# Patient Record
Sex: Male | Born: 1965 | Race: Black or African American | Hispanic: No | Marital: Single | Smoking: Current every day smoker
Health system: Southern US, Community
[De-identification: ages and names within clinical notes are randomized; demographics above are authoritative.]

## PROBLEM LIST (undated history)

## (undated) HISTORY — PX: OTHER SURGICAL HISTORY: SHX169

---

## 1998-03-09 ENCOUNTER — Emergency Department (HOSPITAL_COMMUNITY): Admission: EM | Admit: 1998-03-09 | Discharge: 1998-03-09 | Payer: Self-pay | Admitting: Emergency Medicine

## 2006-12-05 ENCOUNTER — Emergency Department (HOSPITAL_COMMUNITY): Admission: EM | Admit: 2006-12-05 | Discharge: 2006-12-05 | Payer: Self-pay | Admitting: Emergency Medicine

## 2008-11-26 ENCOUNTER — Emergency Department (HOSPITAL_COMMUNITY): Admission: EM | Admit: 2008-11-26 | Discharge: 2008-11-27 | Payer: Self-pay | Admitting: Emergency Medicine

## 2008-12-06 ENCOUNTER — Emergency Department (HOSPITAL_COMMUNITY): Admission: EM | Admit: 2008-12-06 | Discharge: 2008-12-06 | Payer: Self-pay | Admitting: Emergency Medicine

## 2009-12-01 ENCOUNTER — Emergency Department (HOSPITAL_COMMUNITY): Admission: EM | Admit: 2009-12-01 | Discharge: 2009-12-01 | Payer: Self-pay | Admitting: Emergency Medicine

## 2009-12-20 ENCOUNTER — Ambulatory Visit (HOSPITAL_COMMUNITY): Admission: RE | Admit: 2009-12-20 | Discharge: 2009-12-20 | Payer: Self-pay | Admitting: Internal Medicine

## 2010-03-14 ENCOUNTER — Encounter (INDEPENDENT_AMBULATORY_CARE_PROVIDER_SITE_OTHER): Payer: Self-pay | Admitting: Physical Medicine and Rehabilitation

## 2010-03-14 ENCOUNTER — Ambulatory Visit (HOSPITAL_COMMUNITY)
Admission: RE | Admit: 2010-03-14 | Discharge: 2010-03-14 | Payer: Self-pay | Source: Home / Self Care | Attending: Physical Medicine and Rehabilitation | Admitting: Physical Medicine and Rehabilitation

## 2010-04-27 ENCOUNTER — Encounter
Admission: RE | Admit: 2010-04-27 | Discharge: 2010-04-27 | Payer: Self-pay | Source: Home / Self Care | Attending: Internal Medicine | Admitting: Internal Medicine

## 2010-06-05 ENCOUNTER — Ambulatory Visit: Payer: Medicaid Other | Admitting: Rehabilitative and Restorative Service Providers"

## 2010-11-08 ENCOUNTER — Ambulatory Visit: Payer: Medicaid Other | Attending: Physical Medicine and Rehabilitation | Admitting: Occupational Therapy

## 2010-11-08 DIAGNOSIS — M2550 Pain in unspecified joint: Secondary | ICD-10-CM | POA: Insufficient documentation

## 2010-11-08 DIAGNOSIS — M6281 Muscle weakness (generalized): Secondary | ICD-10-CM | POA: Insufficient documentation

## 2010-11-08 DIAGNOSIS — M2560 Stiffness of unspecified joint, not elsewhere classified: Secondary | ICD-10-CM | POA: Insufficient documentation

## 2010-11-08 DIAGNOSIS — IMO0001 Reserved for inherently not codable concepts without codable children: Secondary | ICD-10-CM | POA: Insufficient documentation

## 2010-11-15 ENCOUNTER — Encounter: Payer: Medicaid Other | Admitting: Occupational Therapy

## 2010-11-22 ENCOUNTER — Encounter: Payer: Medicaid Other | Admitting: Occupational Therapy

## 2010-11-29 ENCOUNTER — Ambulatory Visit: Payer: Medicaid Other | Admitting: Occupational Therapy

## 2010-11-29 ENCOUNTER — Emergency Department (HOSPITAL_COMMUNITY)
Admission: EM | Admit: 2010-11-29 | Discharge: 2010-11-29 | Disposition: A | Discharge status: Home / Self Care | Payer: Medicaid Other | Attending: Emergency Medicine | Admitting: Emergency Medicine

## 2011-11-18 ENCOUNTER — Emergency Department (HOSPITAL_COMMUNITY)
Admission: EM | Admit: 2011-11-18 | Discharge: 2011-11-19 | Disposition: A | Discharge status: Home / Self Care | Payer: Self-pay | Attending: Emergency Medicine | Admitting: Emergency Medicine

## 2011-11-18 ENCOUNTER — Encounter (HOSPITAL_COMMUNITY): Payer: Self-pay | Admitting: Emergency Medicine

## 2011-11-18 DIAGNOSIS — F17200 Nicotine dependence, unspecified, uncomplicated: Secondary | ICD-10-CM | POA: Insufficient documentation

## 2011-11-18 DIAGNOSIS — K029 Dental caries, unspecified: Secondary | ICD-10-CM | POA: Insufficient documentation

## 2011-11-18 DIAGNOSIS — K0889 Other specified disorders of teeth and supporting structures: Secondary | ICD-10-CM

## 2011-11-18 NOTE — ED Notes (Signed)
Pt states he had oral surgery about a month ago and they damaged some of his teeth  For the past couple days he has been having mouth pain

## 2011-11-19 MED ORDER — TRAMADOL HCL 50 MG PO TABS
50.0000 mg | ORAL_TABLET | Freq: Once | ORAL | Status: AC
  Administered 2011-11-19: 50 mg via ORAL
  Filled 2011-11-19: qty 1

## 2011-11-19 MED ORDER — TRAMADOL HCL 50 MG PO TABS: 50.0000 mg | ORAL_TABLET | Freq: Four times a day (QID) | ORAL | Status: AC | PRN

## 2011-11-19 MED ORDER — PENICILLIN V POTASSIUM 500 MG PO TABS: 500.0000 mg | ORAL_TABLET | Freq: Three times a day (TID) | ORAL | Status: AC

## 2011-11-19 NOTE — ED Provider Notes (Signed)
Medical screening examination/treatment/procedure(s) were performed by non-physician practitioner and as supervising physician I was immediately available for consultation/collaboration.  Joakim Huesman M Santa Abdelrahman, MD 11/19/11 0821 

## 2011-11-19 NOTE — ED Provider Notes (Signed)
History     CSN: [id number]  Arrival date & time 11/18/11  2336   First MD Initiated Contact with Patient 11/19/11 0145      Chief Complaint  Patient presents with  . Dental Pain   HPI  History provided by the patient. Patient is a 46 year old male with no significant past medical history who presents complaints of dental pains for the past few days. Patient states that he has had off-and-on pains in left upper lower teeth for the past several weeks to months. Pain has increased significantly over the last few days. Patient does report having oral surgery to have a tooth removed one month ago. He does state he had similar symptoms at that time but his dentist only removed one tooth. Pain is worse with any kind shooting. Patient has been using some ibuprofen without significant relief. He has not used any other medication or treatment. He denies any swelling of the face or gums. He denies any difficulty swallowing or breathing. Denies any fever, chills, sweats.    History reviewed. No pertinent past medical history.  Past Surgical History  Procedure Date  . Arm surgery     Family History  Problem Relation Age of Onset  . Hypertension Other   . Diabetes Other   . Cancer Other     History  Substance Use Topics  . Smoking status: Current Everyday Smoker    Types: Cigarettes  . Smokeless tobacco: Not on file  . Alcohol Use: Yes     occ      Review of Systems  Constitutional: Negative for fever and chills.  HENT: Positive for dental problem. Negative for sore throat and trouble swallowing.   Gastrointestinal: Negative for nausea and vomiting.    Allergies  Tylenol  Home Medications  No current outpatient prescriptions on file.  BP 117/71  Pulse 76  Temp 97.5 F (36.4 C) (Oral)  Resp 16  Ht 6\' 1"  (1.854 m)  Wt 225 lb (102.059 kg)  BMI 29.69 kg/m2  SpO2 98%  Physical Exam  Nursing note and vitals reviewed. Constitutional: He is oriented to person, place, and  time. He appears well-developed and well-nourished. No distress.  HENT:  Head: Normocephalic and atraumatic.  Mouth/Throat:         Decay of left upper first molar to the gumline. There is pain to percussion over the left upper second premolar. Several dental caries throughout mouth. There is also tenderness along the left lower first premolar and molar teeth. No swelling of the gums or under the tongue.  Cardiovascular: Normal rate and regular rhythm.   Pulmonary/Chest: Effort normal and breath sounds normal.  Lymphadenopathy:    He has no cervical adenopathy.  Neurological: He is alert and oriented to person, place, and time.  Skin: Skin is warm.    ED Course  Procedures     1. Pain, dental       MDM  1:50PM patient seen and evaluated. Patient offered dental block but prefers not to have any needles. Patient reports adverse tolerance Tylenol with GI upset. Will give dose of Ultram and provide prescription for the same. Patient given dental referral.        Angus Seller, PA 11/19/11 [phone]

## 2012-01-16 ENCOUNTER — Emergency Department (HOSPITAL_COMMUNITY)
Admission: EM | Admit: 2012-01-16 | Discharge: 2012-01-16 | Disposition: A | Discharge status: Home / Self Care | Payer: Self-pay | Attending: Emergency Medicine | Admitting: Emergency Medicine

## 2012-01-16 ENCOUNTER — Emergency Department (HOSPITAL_COMMUNITY): Payer: Self-pay

## 2012-01-16 ENCOUNTER — Encounter (HOSPITAL_COMMUNITY): Payer: Self-pay | Admitting: *Deleted

## 2012-01-16 DIAGNOSIS — S53106A Unspecified dislocation of unspecified ulnohumeral joint, initial encounter: Secondary | ICD-10-CM | POA: Insufficient documentation

## 2012-01-16 DIAGNOSIS — S53006A Unspecified dislocation of unspecified radial head, initial encounter: Secondary | ICD-10-CM

## 2012-01-16 DIAGNOSIS — W11XXXA Fall on and from ladder, initial encounter: Secondary | ICD-10-CM | POA: Insufficient documentation

## 2012-01-16 MED ORDER — HYDROCODONE-ACETAMINOPHEN 5-325 MG PO TABS
1.0000 | ORAL_TABLET | Freq: Once | ORAL | Status: AC
  Administered 2012-01-16: 1 via ORAL
  Filled 2012-01-16: qty 1

## 2012-01-16 MED ORDER — HYDROCODONE-ACETAMINOPHEN 5-325 MG PO TABS: 1.0000 | ORAL_TABLET | Freq: Four times a day (QID) | ORAL | Status: DC | PRN

## 2012-01-16 NOTE — ED Notes (Signed)
Pt reports fall yesterday from a 58ft ladder.  Pt reports he remembers falling and getting up.  Pt reports landing on his L side and face.  Pt reports L upper arm pain and R facial pain.  Pt guarding his L arm.

## 2012-01-16 NOTE — ED Provider Notes (Signed)
History     CSN: [id number]  Arrival date & time 01/16/12  0919   First MD Initiated Contact with Patient 01/16/12 1009      Chief Complaint  Patient presents with  . Fall    L upper arm pain    (Consider location/radiation/quality/duration/timing/severity/associated sxs/prior treatment) HPI The patient presents to the ER with L elbow and L shoulder pain following a fall. The patient states that he was working on a ladder when he missed a rung and fell off. The patient denies LOC, back pain, neck pain, chest pain, abdominal pain, or vomiting. The patient states that he did not take anything prior to arrival.  History reviewed. No pertinent past medical history.  Past Surgical History  Procedure Date  . Arm surgery     Family History  Problem Relation Age of Onset  . Hypertension Other   . Diabetes Other   . Cancer Other     History  Substance Use Topics  . Smoking status: Current Every Day Smoker -- 0.5 packs/day    Types: Cigarettes  . Smokeless tobacco: Not on file  . Alcohol Use: Yes     occ      Review of Systems All other systems negative except as documented in the HPI. All pertinent positives and negatives as reviewed in the HPI.  Allergies  Review of patient's allergies indicates no known allergies.  Home Medications   Current Outpatient Rx  Name Route Sig Dispense Refill  . IBUPROFEN 200 MG PO TABS Oral Take 400 mg by mouth every 6 (six) hours as needed. Pain      Temp 98.7 F (37.1 C) (Oral)  Physical Exam  Nursing note and vitals reviewed. Constitutional: He is oriented to person, place, and time. He appears well-developed and well-nourished.  HENT:  Head: Normocephalic and atraumatic.  Eyes: Pupils are equal, round, and reactive to light.  Cardiovascular: Normal rate, regular rhythm and normal heart sounds.   Pulmonary/Chest: Effort normal and breath sounds normal. No respiratory distress.  Musculoskeletal:       Left shoulder: He  exhibits tenderness. He exhibits normal range of motion and no bony tenderness.       Left elbow: He exhibits swelling. He exhibits normal range of motion, no effusion and no deformity. tenderness found. Radial head tenderness noted.  Neurological: He is alert and oriented to person, place, and time.    ED Course  Procedures (including critical care time)  Labs Reviewed - No data to display Dg Elbow Complete Left  01/16/2012  *RADIOLOGY REPORT*  Clinical Data: Larey Seat.  Elbow pain.  Chronic deformity.  LEFT ELBOW - COMPLETE 3+ VIEW  Comparison: 12/20/2009.  Findings: Exam is quite limited due to chronic deformity.  The lateral film demonstrates a probable slightly impacted radial head fracture. No obvious joint effusion.  IMPRESSION:  1.  Limited examination. 2.  Suspect a slightly impacted radial head fracture.   Original Report Authenticated By: P. Loralie Champagne, M.D.    Dg Shoulder Left  01/16/2012  *RADIOLOGY REPORT*  Clinical Data: Fall.  Left shoulder pain.  LEFT SHOULDER - 2+ VIEW  Comparison: 12/20/2009 radiographs.  Findings: Stable dysmorphic appearance of the humeral head and glenoid.  Glenoid is shallow.  The humeral head is posteriorly located on the scapular Y view, similar to prior exam and compatible with chronic posterior dislocation.  Lateral downsloping of the acromion with upward curvature of the acromion.  No fracture.  IMPRESSION: Chronic changes of the left shoulder  with dysmorphic glenoid and humeral head.  Chronic posterior shoulder dislocation.   Original Report Authenticated By: Andreas Newport, M.D.    The patient is splinted and referred to ortho for further care and management. The patient is advised to use ice and heat on his elbow. Told to return here for any worsening in his condition.    MDM         Carlyle Dolly, PA-C 01/20/12 [phone]

## 2012-01-20 NOTE — ED Provider Notes (Signed)
Medical screening examination/treatment/procedure(s) were performed by non-physician practitioner and as supervising physician I was immediately available for consultation/collaboration.  Juliet Rude. Rubin Payor, MD 01/20/12 1537

## 2012-03-14 ENCOUNTER — Emergency Department (HOSPITAL_COMMUNITY)
Admission: EM | Admit: 2012-03-14 | Discharge: 2012-03-14 | Disposition: A | Discharge status: Home / Self Care | Payer: Self-pay | Attending: Emergency Medicine | Admitting: Emergency Medicine

## 2012-03-14 ENCOUNTER — Encounter (HOSPITAL_COMMUNITY): Payer: Self-pay | Admitting: *Deleted

## 2012-03-14 DIAGNOSIS — B029 Zoster without complications: Secondary | ICD-10-CM | POA: Insufficient documentation

## 2012-03-14 DIAGNOSIS — F17200 Nicotine dependence, unspecified, uncomplicated: Secondary | ICD-10-CM | POA: Insufficient documentation

## 2012-03-14 DIAGNOSIS — Z8619 Personal history of other infectious and parasitic diseases: Secondary | ICD-10-CM | POA: Insufficient documentation

## 2012-03-14 DIAGNOSIS — M549 Dorsalgia, unspecified: Secondary | ICD-10-CM | POA: Insufficient documentation

## 2012-03-14 MED ORDER — ACYCLOVIR 400 MG PO TABS: 800.0000 mg | ORAL_TABLET | Freq: Every day | ORAL | Status: DC

## 2012-03-14 NOTE — ED Provider Notes (Signed)
History     CSN: [id number]  Arrival date & time 03/14/12  8657   First MD Initiated Contact with Patient 03/14/12 [phone]      Chief Complaint  Patient presents with  . Rash  . Back Pain    (Consider location/radiation/quality/duration/timing/severity/associated sxs/prior treatment) HPI Comments: This is a 46 year old male, who presents emergency department with chief complaint of rash on his back. Patient states the rash is been there for the past 2 days. It is associated with burning, tingling, and pain. Patient has past medical history remarkable for the chickenpox. Patient states he first noticed the rash after cutting trees. He has not tried anything to alleviate his symptoms. Patient is in moderate pain. Palpation makes the pain worse, nothing makes the pain better.  Patient is a 46 y.o. male presenting with back pain. The history is provided by the patient. No language interpreter was used.  Back Pain     History reviewed. No pertinent past medical history.  Past Surgical History  Procedure Date  . Arm surgery     Family History  Problem Relation Age of Onset  . Hypertension Other   . Diabetes Other   . Cancer Other     History  Substance Use Topics  . Smoking status: Current Every Day Smoker -- 0.5 packs/day    Types: Cigarettes  . Smokeless tobacco: Not on file  . Alcohol Use: Yes     Comment: occ      Review of Systems  Musculoskeletal: Positive for back pain.  All other systems reviewed and are negative.    Allergies  Review of patient's allergies indicates no known allergies.  Home Medications   Current Outpatient Rx  Name  Route  Sig  Dispense  Refill  . HYDROCODONE-ACETAMINOPHEN 5-325 MG PO TABS   Oral   Take 1 tablet by mouth every 6 (six) hours as needed for pain.   15 tablet   0   . IBUPROFEN 200 MG PO TABS   Oral   Take 400 mg by mouth every 6 (six) hours as needed. Pain           BP 128/59  Pulse 76  Temp 97.8 F (36.6 C)  (Oral)  Resp 16  Ht 6\' 1"  (1.854 m)  Wt 230 lb (104.327 kg)  BMI 30.34 kg/m2  SpO2 96%  Physical Exam  Nursing note and vitals reviewed. Constitutional: He is oriented to person, place, and time. He appears well-developed and well-nourished.  HENT:  Head: Normocephalic and atraumatic.  Eyes: Conjunctivae normal and EOM are normal.  Neck: Normal range of motion.  Cardiovascular: Normal rate.   Pulmonary/Chest: Effort normal.  Abdominal: He exhibits no distension.  Musculoskeletal: Normal range of motion.  Neurological: He is alert and oriented to person, place, and time.  Skin: Skin is warm and dry. Rash noted.       Scattered vesicles, classic zoster appearance in a single dermatome.  Psychiatric: He has a normal mood and affect. His behavior is normal. Judgment and thought content normal.    ED Course  Procedures (including critical care time)  Labs Reviewed - No data to display No results found.   1. Shingles       MDM  Were 16-year-old male with shingles. Will treat the patient with acyclovir and recommend capsaicin cream. I have seen this patient with Dr. Freida Busman. Patient is stable and ready for discharge. Patient understands and agrees with the plan.  Roxy Horseman, PA-C 03/14/12 1015

## 2012-03-14 NOTE — ED Notes (Signed)
Pt reports rash with pus draining on R axillary and R upper back x2 days.

## 2012-03-17 NOTE — ED Provider Notes (Signed)
Medical screening examination/treatment/procedure(s) were performed by non-physician practitioner and as supervising physician I was immediately available for consultation/collaboration.  Toy Baker, MD 03/17/12 [phone]

## 2012-08-19 ENCOUNTER — Emergency Department (HOSPITAL_COMMUNITY): Payer: Self-pay

## 2012-08-19 ENCOUNTER — Emergency Department (HOSPITAL_COMMUNITY)
Admission: EM | Admit: 2012-08-19 | Discharge: 2012-08-19 | Disposition: A | Discharge status: Home / Self Care | Payer: Self-pay | Attending: Emergency Medicine | Admitting: Emergency Medicine

## 2012-08-19 ENCOUNTER — Encounter (HOSPITAL_COMMUNITY): Payer: Self-pay | Admitting: *Deleted

## 2012-08-19 DIAGNOSIS — Z9889 Other specified postprocedural states: Secondary | ICD-10-CM | POA: Insufficient documentation

## 2012-08-19 DIAGNOSIS — M25511 Pain in right shoulder: Secondary | ICD-10-CM

## 2012-08-19 DIAGNOSIS — M25519 Pain in unspecified shoulder: Secondary | ICD-10-CM | POA: Insufficient documentation

## 2012-08-19 DIAGNOSIS — F17200 Nicotine dependence, unspecified, uncomplicated: Secondary | ICD-10-CM | POA: Insufficient documentation

## 2012-08-19 DIAGNOSIS — Z8776 Personal history of (corrected) congenital malformations of integument, limbs and musculoskeletal system: Secondary | ICD-10-CM | POA: Insufficient documentation

## 2012-08-19 DIAGNOSIS — Z87768 Personal history of other specified (corrected) congenital malformations of integument, limbs and musculoskeletal system: Secondary | ICD-10-CM | POA: Insufficient documentation

## 2012-08-19 MED ORDER — OXYCODONE-ACETAMINOPHEN 5-325 MG PO TABS
2.0000 | ORAL_TABLET | Freq: Once | ORAL | Status: AC
  Administered 2012-08-19: 2 via ORAL
  Filled 2012-08-19: qty 2

## 2012-08-19 MED ORDER — HYDROCODONE-ACETAMINOPHEN 5-325 MG PO TABS: 2.0000 | ORAL_TABLET | ORAL | Status: DC | PRN

## 2012-08-19 MED ORDER — NAPROXEN 500 MG PO TABS: 500.0000 mg | ORAL_TABLET | Freq: Two times a day (BID) | ORAL | Status: DC

## 2012-08-19 NOTE — ED Provider Notes (Signed)
History     CSN: [id number]  Arrival date & time 08/19/12  0414   First MD Initiated Contact with Patient 08/19/12 0431      Chief Complaint  Patient presents with  . Arm Injury    (Consider location/radiation/quality/duration/timing/severity/associated sxs/prior treatment) HPI Comments: 47 year old male who presents with a complaint of right shoulder pain. He states that he has had pain in his right shoulder for approximately 6 months. It was gradual in onset, persistent, gradually worsening and has been in tolerable over the last couple of months. He felt like he could not sleep well tonight because of the constant pain, thus he presents to the emergency department for evaluation. He denies numbness or weakness in the right upper extremity, pain is worse with range of motion of the right shoulder but he has no pain with elbow or wrist on that side. 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 He denies swelling of the shoulder, fever, vomiting or redness of the skin. He has been evaluated by [facility]oped[facility] in the past, this shoulder has been imaged approximately one year ago, he has never been told that he had arthritis or any other significant abnormalities according to his report.  Patient is a 47 y.o. male presenting with arm injury. The history is provided by the patient and medical records.  Arm Injury Associated symptoms: no fever     History reviewed. No pertinent past medical history.  Past Surgical History  Procedure Laterality Date  . Arm surgery      Family History  Problem Relation Age of Onset  . Hypertension Other   . Diabetes Other   . Cancer Other     History  Substance Use Topics  . Smoking status: Current Every Day Smoker -- 0.50 packs/day    Types: Cigarettes  . Smokeless tobacco: Not on file  . Alcohol Use: Yes     Comment: occ      Review of  Systems  Constitutional: Negative for fever.  Musculoskeletal: Negative for joint swelling.  Skin: Negative for rash.    Allergies  Review of patient's allergies indicates no known allergies.  Home Medications   Current Outpatient Rx  Name  Route  Sig  Dispense  Refill  . ibuprofen (ADVIL,MOTRIN) 200 MG tablet   Oral   Take 400 mg by mouth every 6 (six) hours as needed. Pain         . acyclovir (ZOVIRAX) 400 MG tablet   Oral   Take 2 tablets (800 mg total) by mouth 5 (five) times daily.   50 tablet   0   . HYDROcodone-acetaminophen (NORCO/VICODIN) 5-325 MG per tablet   Oral   Take 1 tablet by mouth every 6 (six) hours as needed for pain.   15 tablet   0   . HYDROcodone-acetaminophen (NORCO/VICODIN) 5-325 MG per tablet   Oral   Take 2 tablets by mouth every 4 (four) hours as needed for pain.   6 tablet   0   . naproxen (NAPROSYN) 500 MG tablet   Oral   Take 1 tablet (500 mg total) by mouth 2 (two) times daily with a meal.   30 tablet   0     BP 130/69  Pulse 65  Temp(Src) 97.9 F (36.6 C) (Oral)  Resp 16  Ht 6\' 1"  (1.854 m)  Wt 220 lb (99.791 kg)  BMI 29.03  kg/m2  SpO2 97%  Physical Exam  Nursing note and vitals reviewed. Constitutional: He appears well-developed and well-nourished. No distress.  HENT:  Head: Normocephalic and atraumatic.  Eyes: Conjunctivae are normal. No scleral icterus.  Pulmonary/Chest: Effort normal.  Musculoskeletal: He exhibits tenderness ( Tenderness with palpation over the right shoulder girdle, mild pain with internal and external rotation of the shoulder, moderate pain with abduction of the right shoulder.). He exhibits no edema.  Normal exam of the right elbow and wrist and hand, muscle atrophy of the left upper extremity with deformity. Bilateral lower extremities appear normal  Neurological:  Normal sensation and strength of the right upper extremity  Skin: Skin is warm and dry. No rash noted.    ED Course  Procedures  (including critical care time)  Labs Reviewed - No data to display No results found.   1. Shoulder pain, right       MDM  The patient has right upper extremity focal abnormality with pain in the right shoulder though this appears to be over the last 6 months. There is no signs of septic arthritis, no fever, no redness, no warmth. Given the length of symptoms I will obtain a x-ray to rule out significant arthritis or any mass lesion, bony lesion or other abnormalities. Pain medications ordered, patient has good orthopedic followup.   I have personally interpretted the xray of the R shoulder.  I see no signs of fracture or dislocation - I have informed the patient of the findings and have asked him to acquire his medical records and share them with his orthopedist this week.  Meds given in ED:  Medications  oxyCODONE-acetaminophen (PERCOCET/ROXICET) 5-325 MG per tablet 2 tablet (2 tablets Oral Given 08/19/12 0524)    New Prescriptions   HYDROCODONE-ACETAMINOPHEN (NORCO/VICODIN) 5-325 MG PER TABLET    Take 2 tablets by mouth every 4 (four) hours as needed for pain.   NAPROXEN (NAPROSYN) 500 MG TABLET    Take 1 tablet (500 mg total) by mouth 2 (two) times daily with a meal.         Vida Roller, MD 08/19/12 [phone]

## 2012-08-19 NOTE — ED Notes (Signed)
Pt reports right arm pain for the past 2 m onths. States he came to the ER tonight because he can not sleep.

## 2012-09-30 ENCOUNTER — Encounter (HOSPITAL_COMMUNITY): Payer: Self-pay | Admitting: *Deleted

## 2012-09-30 ENCOUNTER — Emergency Department (HOSPITAL_COMMUNITY)
Admission: EM | Admit: 2012-09-30 | Discharge: 2012-09-30 | Disposition: A | Discharge status: Home / Self Care | Payer: Self-pay | Attending: Emergency Medicine | Admitting: Emergency Medicine

## 2012-09-30 DIAGNOSIS — F17200 Nicotine dependence, unspecified, uncomplicated: Secondary | ICD-10-CM | POA: Insufficient documentation

## 2012-09-30 DIAGNOSIS — Z79899 Other long term (current) drug therapy: Secondary | ICD-10-CM | POA: Insufficient documentation

## 2012-09-30 DIAGNOSIS — R21 Rash and other nonspecific skin eruption: Secondary | ICD-10-CM | POA: Insufficient documentation

## 2012-09-30 MED ORDER — NYSTATIN 100000 UNIT/GM EX POWD: Freq: Four times a day (QID) | CUTANEOUS | Status: DC

## 2012-09-30 NOTE — ED Notes (Signed)
Pt reporting rash on groin area.  No relief from hydrocortisone cream.  Reports he's had rash for about 2 weeks.

## 2012-09-30 NOTE — ED Provider Notes (Signed)
History    CSN: [id number] Arrival date & time 09/30/12  0135  First MD Initiated Contact with Patient 09/30/12 0230     Chief Complaint  Patient presents with  . Rash   (Consider location/radiation/quality/duration/timing/severity/associated sxs/prior Treatment) HPI  HPI Comments: Jack Bennett is a 47 y.o. male who presents to the Emergency Department complaining of a rash to his groin area that he has had for 2 weeks. He has been using hydrocortisone cream which seems to have made it worse. Denies fever, chills, nausea, vomiting. History reviewed. No pertinent past medical history. Past Surgical History  Procedure Laterality Date  . Arm surgery     Family History  Problem Relation Age of Onset  . Hypertension Other   . Diabetes Other   . Cancer Other    History  Substance Use Topics  . Smoking status: Current Every Day Smoker -- 0.50 packs/day    Types: Cigarettes  . Smokeless tobacco: Not on file  . Alcohol Use: Yes     Comment: occ    Review of Systems  Constitutional: Negative for fever.       10 Systems reviewed and are negative for acute change except as noted in the HPI.  HENT: Negative for congestion.   Eyes: Negative for discharge and redness.  Respiratory: Negative for cough and shortness of breath.   Cardiovascular: Negative for chest pain.  Gastrointestinal: Negative for vomiting and abdominal pain.  Musculoskeletal: Negative for back pain.  Skin: Positive for rash.  Neurological: Negative for syncope, numbness and headaches.  Psychiatric/Behavioral:       No behavior change.    Allergies  Review of patient's allergies indicates no known allergies.  Home Medications   Current Outpatient Rx  Name  Route  Sig  Dispense  Refill  . acyclovir (ZOVIRAX) 400 MG tablet   Oral   Take 2 tablets (800 mg total) by mouth 5 (five) times daily.   50 tablet   0   . HYDROcodone-acetaminophen (NORCO/VICODIN) 5-325 MG per tablet   Oral   Take 1 tablet  by mouth every 6 (six) hours as needed for pain.   15 tablet   0   . HYDROcodone-acetaminophen (NORCO/VICODIN) 5-325 MG per tablet   Oral   Take 2 tablets by mouth every 4 (four) hours as needed for pain.   6 tablet   0   . ibuprofen (ADVIL,MOTRIN) 200 MG tablet   Oral   Take 400 mg by mouth every 6 (six) hours as needed. Pain         . naproxen (NAPROSYN) 500 MG tablet   Oral   Take 1 tablet (500 mg total) by mouth 2 (two) times daily with a meal.   30 tablet   0    BP 132/78  Pulse 73  Temp(Src) 97.5 F (36.4 C) (Oral)  Resp 20  Ht 6\' 1"  (1.854 m)  Wt 230 lb (104.327 kg)  BMI 30.35 kg/m2  SpO2 98% Physical Exam  Nursing note and vitals reviewed. Constitutional: He appears well-developed and well-nourished.  Awake, alert, nontoxic appearance.  HENT:  Head: Normocephalic and atraumatic.  Eyes: EOM are normal. Pupils are equal, round, and reactive to light.  Neck: Neck supple.  Cardiovascular: Normal rate and intact distal pulses.   Pulmonary/Chest: Effort normal. He exhibits no tenderness.  Abdominal: Soft. There is no tenderness. There is no rebound.  Genitourinary:  confluent rash to groin  Musculoskeletal: He exhibits no tenderness.  Baseline ROM, no  obvious new focal weakness.  Neurological:  Mental status and motor strength appears baseline for patient and situation.  Skin: No rash noted.  Psychiatric: He has a normal mood and affect.    ED Course  Procedures (including critical care time)  MDM  Patient with a c/o rash to the groin. Will Rx microgard powder. Pt stable in ED with no significant deterioration in condition.The patient appears reasonably screened and/or stabilized for discharge and I doubt any other medical condition or other [facility] requiring further screening, evaluation, or treatment in the ED at this time prior to discharge.  MDM Reviewed: nursing note and vitals     Nicoletta Dress. Colon Branch, MD 09/30/12 [phone]

## 2013-06-25 ENCOUNTER — Emergency Department (HOSPITAL_COMMUNITY)
Admission: EM | Admit: 2013-06-25 | Discharge: 2013-06-25 | Disposition: A | Discharge status: Home / Self Care | Payer: Self-pay

## 2013-06-25 NOTE — ED Notes (Signed)
Pt checked in and then walked out front door and hasn't returned

## 2013-06-27 ENCOUNTER — Encounter (HOSPITAL_COMMUNITY): Payer: Self-pay | Admitting: Emergency Medicine

## 2013-06-27 ENCOUNTER — Emergency Department (HOSPITAL_COMMUNITY)
Admission: EM | Admit: 2013-06-27 | Discharge: 2013-06-28 | Disposition: A | Discharge status: Home / Self Care | Payer: No Typology Code available for payment source | Attending: Emergency Medicine | Admitting: Emergency Medicine

## 2013-06-27 DIAGNOSIS — M545 Low back pain, unspecified: Secondary | ICD-10-CM

## 2013-06-27 DIAGNOSIS — IMO0002 Reserved for concepts with insufficient information to code with codable children: Secondary | ICD-10-CM | POA: Insufficient documentation

## 2013-06-27 DIAGNOSIS — Y92410 Unspecified street and highway as the place of occurrence of the external cause: Secondary | ICD-10-CM | POA: Insufficient documentation

## 2013-06-27 DIAGNOSIS — Y9389 Activity, other specified: Secondary | ICD-10-CM | POA: Insufficient documentation

## 2013-06-27 DIAGNOSIS — F17200 Nicotine dependence, unspecified, uncomplicated: Secondary | ICD-10-CM | POA: Insufficient documentation
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
## 2013-06-28 ENCOUNTER — Emergency Department (HOSPITAL_COMMUNITY): Payer: No Typology Code available for payment source

## 2013-06-28 MED ORDER — CYCLOBENZAPRINE HCL 5 MG PO TABS: 5.0000 mg | ORAL_TABLET | Freq: Three times a day (TID) | ORAL | Status: DC | PRN

## 2013-06-28 MED ORDER — DEXAMETHASONE SODIUM PHOSPHATE 10 MG/ML IJ SOLN
10.0000 mg | Freq: Once | INTRAMUSCULAR | Status: AC
  Administered 2013-06-28: 10 mg via INTRAMUSCULAR
  Filled 2013-06-28: qty 1

## 2013-06-28 MED ORDER — KETOROLAC TROMETHAMINE 30 MG/ML IJ SOLN
30.0000 mg | Freq: Once | INTRAMUSCULAR | Status: AC
  Administered 2013-06-28: 30 mg via INTRAMUSCULAR
  Filled 2013-06-28: qty 1

## 2013-06-28 MED ORDER — NAPROXEN 500 MG PO TABS: 500.0000 mg | ORAL_TABLET | Freq: Two times a day (BID) | ORAL | Status: DC
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
## 2013-06-28 NOTE — ED Notes (Signed)
Patient transported to X-ray via wheelchair 

## 2013-06-29 NOTE — ED Provider Notes (Signed)
Medical screening examination/treatment/procedure(s) were performed by non-physician practitioner and as supervising physician I was immediately available for consultation/collaboration.   EKG Interpretation None       Jack NielsenBrian Sanye Ledesma, MD 06/29/13 [phone]

## 2013-07-21 ENCOUNTER — Other Ambulatory Visit (HOSPITAL_COMMUNITY): Payer: Self-pay | Admitting: Chiropractic Medicine

## 2013-07-21 DIAGNOSIS — M545 Low back pain, unspecified: Secondary | ICD-10-CM

## 2013-07-27 ENCOUNTER — Ambulatory Visit (HOSPITAL_COMMUNITY)
Admission: RE | Admit: 2013-07-27 | Discharge: 2013-07-27 | Disposition: A | Discharge status: Home / Self Care | Payer: No Typology Code available for payment source | Source: Ambulatory Visit | Attending: Chiropractic Medicine | Admitting: Chiropractic Medicine

## 2013-07-27 DIAGNOSIS — M545 Low back pain, unspecified: Secondary | ICD-10-CM | POA: Insufficient documentation

## 2013-07-27 DIAGNOSIS — M5126 Other intervertebral disc displacement, lumbar region: Secondary | ICD-10-CM | POA: Insufficient documentation

## 2013-07-27 DIAGNOSIS — M47817 Spondylosis without myelopathy or radiculopathy, lumbosacral region: Secondary | ICD-10-CM | POA: Insufficient documentation

## 2019-02-24 ENCOUNTER — Other Ambulatory Visit: Payer: Self-pay

## 2019-02-24 ENCOUNTER — Emergency Department (HOSPITAL_COMMUNITY): Payer: Medicare Other

## 2019-02-24 ENCOUNTER — Emergency Department (HOSPITAL_COMMUNITY)
Admission: EM | Admit: 2019-02-24 | Discharge: 2019-02-24 | Disposition: A | Discharge status: Home / Self Care | Payer: Medicare Other | Attending: Emergency Medicine | Admitting: Emergency Medicine

## 2019-02-24 ENCOUNTER — Encounter (HOSPITAL_COMMUNITY): Payer: Self-pay

## 2019-02-24 DIAGNOSIS — F1210 Cannabis abuse, uncomplicated: Secondary | ICD-10-CM | POA: Insufficient documentation

## 2019-02-24 DIAGNOSIS — W1789XA Other fall from one level to another, initial encounter: Secondary | ICD-10-CM | POA: Diagnosis not present

## 2019-02-24 DIAGNOSIS — F17210 Nicotine dependence, cigarettes, uncomplicated: Secondary | ICD-10-CM | POA: Diagnosis not present

## 2019-02-24 DIAGNOSIS — Y999 Unspecified external cause status: Secondary | ICD-10-CM | POA: Insufficient documentation

## 2019-02-24 DIAGNOSIS — W19XXXA Unspecified fall, initial encounter: Secondary | ICD-10-CM

## 2019-02-24 DIAGNOSIS — Z79899 Other long term (current) drug therapy: Secondary | ICD-10-CM | POA: Insufficient documentation

## 2019-02-24 DIAGNOSIS — S0990XA Unspecified injury of head, initial encounter: Secondary | ICD-10-CM | POA: Insufficient documentation

## 2019-02-24 DIAGNOSIS — Y92008 Other place in unspecified non-institutional (private) residence as the place of occurrence of the external cause: Secondary | ICD-10-CM | POA: Diagnosis not present

## 2019-02-24 DIAGNOSIS — Y939 Activity, unspecified: Secondary | ICD-10-CM | POA: Insufficient documentation

## 2019-02-24 DIAGNOSIS — R109 Unspecified abdominal pain: Secondary | ICD-10-CM | POA: Insufficient documentation

## 2019-02-24 LAB — COMPREHENSIVE METABOLIC PANEL
ALT: 23 U/L (ref 0–44)
AST: 21 U/L (ref 15–41)
Albumin: 4.5 g/dL (ref 3.5–5.0)
Alkaline Phosphatase: 67 U/L (ref 38–126)
Anion gap: 8 (ref 5–15)
BUN: 18 mg/dL (ref 6–20)
CO2: 23 mmol/L (ref 22–32)
Calcium: 9.1 mg/dL (ref 8.9–10.3)
Chloride: 106 mmol/L (ref 98–111)
Creatinine, Ser: 0.99 mg/dL (ref 0.61–1.24)
GFR calc Af Amer: >60 mL/min (ref >60)
GFR calc non Af Amer: >60 mL/min (ref >60)
Glucose, Bld: 123 mg/dL — ABNORMAL HIGH (ref 70–99)
Potassium: 3.8 mmol/L (ref 3.5–5.1)
Sodium: 137 mmol/L (ref 135–145)
Total Bilirubin: 1 mg/dL (ref 0.3–1.2)
Total Protein: 7.5 g/dL (ref 6.5–8.1)

## 2019-02-24 LAB — CBC WITH DIFFERENTIAL/PLATELET
Abs Immature Granulocytes: 0.04 10*3/uL (ref 0.00–0.07)
Basophils Absolute: 0.1 10*3/uL (ref 0.0–0.1)
Basophils Relative: 1 %
Eosinophils Absolute: 0.3 10*3/uL (ref 0.0–0.5)
Eosinophils Relative: 3 %
HCT: 46.7 % (ref 39.0–52.0)
Hemoglobin: 15.5 g/dL (ref 13.0–17.0)
Immature Granulocytes: 0 %
Lymphocytes Relative: 14 %
Lymphs Abs: 1.3 10*3/uL (ref 0.7–4.0)
MCH: 29.6 pg (ref 26.0–34.0)
MCHC: 33.2 g/dL (ref 30.0–36.0)
MCV: 89.3 fL (ref 80.0–100.0)
Monocytes Absolute: 0.7 10*3/uL (ref 0.1–1.0)
Monocytes Relative: 8 %
Neutro Abs: 6.7 10*3/uL (ref 1.7–7.7)
Neutrophils Relative %: 74 %
Platelets: 320 10*3/uL (ref 150–400)
RBC: 5.23 MIL/uL (ref 4.22–5.81)
RDW: 13.4 % (ref 11.5–15.5)
WBC: 9 10*3/uL (ref 4.0–10.5)
nRBC: 0 % (ref 0.0–0.2)

## 2019-02-24 MED ORDER — MELOXICAM 15 MG PO TABS: 15.0000 mg | ORAL_TABLET | Freq: Every day | ORAL | 0 refills | Status: DC

## 2019-02-24 MED ORDER — METHOCARBAMOL 500 MG PO TABS: 500.0000 mg | ORAL_TABLET | Freq: Two times a day (BID) | ORAL | 0 refills | Status: DC

## 2019-02-24 MED ORDER — OXYCODONE-ACETAMINOPHEN 5-325 MG PO TABS: 1.0000 | ORAL_TABLET | Freq: Four times a day (QID) | ORAL | 0 refills | Status: AC | PRN

## 2019-02-24 MED ORDER — OXYCODONE-ACETAMINOPHEN 5-325 MG PO TABS
1.0000 | ORAL_TABLET | Freq: Once | ORAL | Status: AC
  Administered 2019-02-24: 1 via ORAL
  Filled 2019-02-24: qty 1

## 2019-02-24 MED ORDER — SODIUM CHLORIDE (PF) 0.9 % IJ SOLN
INTRAMUSCULAR | Status: AC
  Filled 2019-02-24: qty 50

## 2019-02-24 MED ORDER — IOHEXOL 300 MG/ML  SOLN
100.0000 mL | Freq: Once | INTRAMUSCULAR | Status: AC | PRN
  Administered 2019-02-24: 16:00:00 100 mL via INTRAVENOUS

## 2019-02-24 MED ORDER — HYDROCODONE-ACETAMINOPHEN 5-325 MG PO TABS: 1.0000 | ORAL_TABLET | Freq: Four times a day (QID) | ORAL | 0 refills | Status: DC | PRN

## 2019-02-24 MED ORDER — MELOXICAM 15 MG PO TABS: 15.0000 mg | ORAL_TABLET | Freq: Every day | ORAL | 0 refills | Status: AC

## 2019-02-24 MED ORDER — HYDROMORPHONE HCL 1 MG/ML IJ SOLN
1.0000 mg | Freq: Once | INTRAMUSCULAR | Status: AC
  Administered 2019-02-24: 18:00:00 1 mg via INTRAVENOUS
  Filled 2019-02-24: qty 1

## 2019-02-24 MED ORDER — HYDROMORPHONE HCL 1 MG/ML IJ SOLN: 1.0000 mg | Freq: Once | INTRAMUSCULAR | Status: DC

## 2019-02-24 NOTE — ED Provider Notes (Signed)
  Face-to-face evaluation   History: Here for evaluation of fall which occurred last night, he was leaning forward, slipped and lost balance, and was unable to catch himself so he fell forward onto his face.  Presents complaining of pain in the right flank and neck.  He also feels like he hit his face and head.  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  Physical exam: Alert, calm and cooperative.  Head with an abrasion and contusion left forehead.  Mild left cheek tenderness and swelling.  No trismus.  Somewhat diminished neck flexion secondary posterior cervical pain and tenderness.  Moderate lumbar tenderness to palpation.  Patient walked with a slow gait.  Medical screening examination/treatment/procedure(s) were conducted as a shared visit with non-physician practitioner(s) and myself.  I personally evaluated the patient during the encounter    Daleen Bo, MD 02/26/19 1313

## 2019-02-24 NOTE — ED Triage Notes (Addendum)
Patient reports falling from 61ft balcony onto his right side.   C/o right flank pain and right neck pain.    A/Ox4 Ambulatory in triage.   Denies blood thinners.

## 2019-02-24 NOTE — ED Provider Notes (Addendum)
[facility]-EMERGENCY DEPT Provider Note   CSN: [id number] Arrival date & time: 02/24/19  1321     History   Chief Complaint Chief Complaint  Patient presents with   Fall    HPI Jack Bennett is a 53 y.o. male with no significant past medical history who presents to the ED after a mechanical fall off a balcony last night around 9-10pm. Patient notes he was trying to close a door and lost balance which caused him to fall directly on his face onto rocks. 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 Patient admits to 10/10 neck pain, right flank pain, and face pain. Patient has not tried anything for pain. Pain is worse with movement. He is not on any blood thinners. Patient denies changes to vision. Patient denies chest pain, shortness of breath, nausea, vomiting, and diarrhea. No bowel/bladder incontinence or saddle paraesthesia.   History reviewed. No pertinent past medical history.  There are no active problems to display for this patient.   Past Surgical History:  Procedure Laterality Date   arm surgery          Home Medications    Prior to Admission medications   Medication Sig Start Date End Date Taking? Authorizing Provider  cyclobenzaprine (FLEXERIL) 5 MG tablet Take 1 tablet (5 mg total) by mouth 3 (three) times daily as needed for muscle spasms. 06/28/13   Rubye Oaks, Janene Harvey, PA-C  ibuprofen (ADVIL,MOTRIN) 200 MG tablet Take 400 mg by mouth every 6 (six) hours as needed for moderate pain. Pain    [provider]  meloxicam (MOBIC) 15 MG tablet Take 1 tablet (15 mg total) by mouth daily. 02/24/19   Cheek, Vesta Mixer, PA-C  methocarbamol (ROBAXIN) 500 MG tablet Take 1 tablet (500 mg total) by mouth 2 (two) times daily. 02/24/19   Cheek, Vesta Mixer, PA-C  naproxen (NAPROSYN) 500 MG tablet Take 1 tablet (500 mg total) by mouth 2 (two) times daily with a meal. 06/28/13   Palmer, Janene Harvey, PA-C    oxyCODONE-acetaminophen (PERCOCET/ROXICET) 5-325 MG tablet Take 1 tablet by mouth every 6 (six) hours as needed for severe pain. 02/24/19   Renee Harder, PA-C    Family History Family History  Problem Relation Age of Onset   Hypertension Other    Diabetes Other    Cancer Other     Social History Social History   Tobacco Use   Smoking status: Current Every Day Smoker    Packs/day: 0.50    Types: Cigarettes  Substance Use Topics   Alcohol use: Yes    Comment: occ   Drug use: Yes    Types: Marijuana     Allergies   Fish-derived products   Review of Systems Review of Systems  Constitutional: Negative for chills and fever.  Eyes: Negative for visual disturbance.  Respiratory: Negative for shortness of breath.   Cardiovascular: Negative for chest pain and leg swelling.  Genitourinary: Positive for flank pain (right flank pain). Negative for difficulty urinating and dysuria.  Musculoskeletal: Positive for back pain, gait problem, myalgias, neck pain and neck stiffness.  Neurological: Positive for headaches. Negative for numbness.     Physical Exam Updated Vital Signs BP (!) 142/84 (BP Location: Right Arm)    Pulse 89    Temp 98.2 F (36.8 C) (Oral)    Resp 15    Ht  (1.854 m)    Wt 99.8 kg  SpO2 97%    BMI 29.03 kg/m   Physical Exam Vitals signs and nursing note reviewed.  Constitutional:      General: He is not in acute distress.    Appearance: He is not toxic-appearing.     Comments: Appears uncomfortable in chair  HENT:     Head: Normocephalic.     Right Ear: Tympanic membrane normal.     Left Ear: Tympanic membrane normal.     Ears:     Comments: No hemotympanum    Nose: Nose normal.     Comments: No septal hematoma Eyes:     Extraocular Movements: Extraocular movements intact.     Pupils: Pupils are equal, round, and reactive to light.     Comments: Small abrasion over left eye. Tenderness to palpation over left orbit. No battle sign.  No raccoon eyes.   Neck:     Musculoskeletal: Neck supple.     Comments: Cervical midline tenderness. Limited ROM due to pain. No deformity. No crepitus. No stepoff. Cardiovascular:     Rate and Rhythm: Normal rate and regular rhythm.     Pulses: Normal pulses.     Heart sounds: Normal heart sounds. No murmur. No friction rub. No gallop.   Pulmonary:     Effort: Pulmonary effort is normal.     Breath sounds: Normal breath sounds.     Comments: Clear to auscultation bilaterally Abdominal:     General: Abdomen is flat. There is no distension.     Palpations: Abdomen is soft.     Tenderness: There is abdominal tenderness. There is no guarding or rebound.     Comments: Tenderness to palpation in right flank area.  Musculoskeletal:     Comments: No T-spine midlines tenderness. Lumbar midline tenderness. no stepoff or deformity, reproducible paraspinal tenderness in lumbar region. No leg edema bilaterally DP/PT pulses 2+ and equal bilaterally Sensation grossly intact bilaterally Able to ambulate in ED   Skin:    General: Skin is warm and dry.  Neurological:     General: No focal deficit present.     Comments: Speech is clear, able to follow commands CN III-XII intact Left arm with birth deformity, weak, and spastic Able to move all other extremities without ataxia       ED Treatments / Results  Labs (all labs ordered are listed, but only abnormal results are displayed) Labs Reviewed  COMPREHENSIVE METABOLIC PANEL - Abnormal; Notable for the following components:      Result Value   Glucose, Bld 123 (*)    All other components within normal limits  CBC WITH DIFFERENTIAL/PLATELET    EKG None  Radiology Ct Head Wo Contrast  Result Date: 02/24/2019 CLINICAL DATA:  Larey Seat from a 4 ft balcony to right side EXAM: CT HEAD WITHOUT CONTRAST CT MAXILLOFACIAL WITHOUT CONTRAST CT CERVICAL SPINE WITHOUT CONTRAST TECHNIQUE: Multidetector CT imaging of the head, cervical spine, and  maxillofacial structures were performed using the standard protocol without intravenous contrast. Multiplanar CT image reconstructions of the cervical spine and maxillofacial structures were also generated. COMPARISON:  None. FINDINGS: CT HEAD FINDINGS Brain: No evidence of acute infarction, hemorrhage, hydrocephalus, extra-axial collection or mass lesion/mass effect. Vascular: Atherosclerotic calcification of the carotid siphons. No hyperdense vessel. Skull: No calvarial fracture or suspicious osseous lesion. No scalp swelling or hematoma. Other: None CT MAXILLOFACIAL FINDINGS Osseous: No fracture of the bony orbits. Mildly comminuted bilateral nasal bone fractures. Nasal spines are intact. No mid face fractures are seen. The pterygoid  plates are intact. The mandible is intact. Temporomandibular joints are normally aligned. No temporal bone fractures are identified. No fractured or avulsed teeth. Several absent teeth as well as multiple carious lesions and periapical lucencies of the maxillary and mandibular dentition. Orbits: The globes appear normal and symmetric. Symmetric appearance of the extraocular musculature and optic nerve sheath complexes. Normal caliber of the superior ophthalmic veins. Sinuses: Mild mural thickening in the maxillary sinuses and ethmoids. Mastoid air cells and middle ear cavities are predominantly clear. Ossicular chains are normally configured. Soft tissues: Minimal asymmetric left periorbital and malar soft tissue swelling. Mild thickening across the nasal bridge. No soft tissue gas or foreign body. CT CERVICAL SPINE FINDINGS Alignment: No stabilization collar in place at the time of imaging. There is straightening of the normal cervical lordosis which is likely positional prop in of the patient's head. Craniocervical and atlantoaxial articulations are maintained. No abnormal facet widening is seen. No jumped or perched facets. Skull base and vertebrae: No acute fracture. No primary  bone lesion or focal pathologic process. Degenerative ossification is noted at the tip of the dens. Likely degenerative vertebral body height loss at C4 and C5. No suspicious osseous lesions. Soft tissues and spinal canal: No pre or paravertebral fluid or swelling. No visible canal hematoma. Disc levels: Mild multilevel intervertebral disc height loss with mild cervical spondylitic changes maximal C3-C6 without significant canal stenosis or foraminal narrowing the cervical spine. Upper chest: No acute abnormality in the upper chest or imaged lung apices. Other: None IMPRESSION: 1. No acute intracranial abnormality. No calvarial fracture or scalp hematoma. 2. Mildly comminuted bilateral nasal bone fractures. Mild soft tissue swelling. 3. Minimal left periorbital and malar soft tissue swelling. No other facial bone fracture. 4. Multiple carious lesions and periapical lucencies. Correlate with dental exam. 5. No acute cervical spine fracture or traumatic listhesis. Minimal cervical spondylitic changes. Electronically Signed   By: Kreg Shropshire M.D.   On: 02/24/2019 17:59   Ct Chest W Contrast  Result Date: 02/24/2019 CLINICAL DATA:  Blunt trauma. Fall from balcony onto right side. Right flank pain. Right neck pain. EXAM: CT CHEST, ABDOMEN, AND PELVIS WITH CONTRAST TECHNIQUE: Multidetector CT imaging of the chest, abdomen and pelvis was performed following the standard protocol during bolus administration of intravenous contrast. CONTRAST:  OMNIPAQUE IOHEXOL 300 MG/ML  SOLN COMPARISON:  None. FINDINGS: CT CHEST FINDINGS Cardiovascular: Normal heart size. No significant pericardial fluid/thickening. Great vessels are normal in course and caliber. No evidence of acute thoracic aortic injury. No central pulmonary emboli. Mediastinum/Nodes: No pneumomediastinum. No mediastinal hematoma. No discrete thyroid nodules. Unremarkable esophagus. No axillary, mediastinal or hilar lymphadenopathy. Lungs/Pleura: No  pneumothorax. No pleural effusion. No acute consolidative airspace disease, lung masses or significant pulmonary nodules. No pneumatoceles. Musculoskeletal: No aggressive appearing focal osseous lesions. No fracture detected in the chest. Minimal thoracic spondylosis. CT ABDOMEN PELVIS FINDINGS Hepatobiliary: Normal liver with no liver laceration or mass. Normal gallbladder with no radiopaque cholelithiasis. No biliary ductal dilatation. Pancreas: Normal, with no laceration, mass or duct dilation. Spleen: Normal size. No laceration or mass. Adrenals/Urinary Tract: Normal adrenals. No hydronephrosis. No renal laceration. No renal mass. Normal bladder. Stomach/Bowel: Grossly normal stomach. Normal caliber small bowel with no small bowel wall thickening. Normal appendix. Mild sigmoid diverticulosis, with no large bowel wall thickening or significant pericolonic fat stranding. Vascular/Lymphatic: Atherosclerotic nonaneurysmal abdominal aorta. No evidence of acute abdominal aortic injury. No pathologically enlarged lymph nodes in the abdomen or pelvis. Reproductive: Top-normal size prostate. Other:  No pneumoperitoneum, ascites or focal fluid collection. Musculoskeletal: No aggressive appearing focal osseous lesions. No fracture in the abdomen or pelvis. IMPRESSION: No acute traumatic injury in the chest, abdomen or pelvis. Mild sigmoid diverticulosis. Aortic Atherosclerosis (ICD10-I70.0). Electronically Signed   By: Delbert Phenix M.D.   On: 02/24/2019 18:00   Ct Cervical Spine Wo Contrast  Result Date: 02/24/2019 CLINICAL DATA:  Larey Seat from a 4 ft balcony to right side EXAM: CT HEAD WITHOUT CONTRAST CT MAXILLOFACIAL WITHOUT CONTRAST CT CERVICAL SPINE WITHOUT CONTRAST TECHNIQUE: Multidetector CT imaging of the head, cervical spine, and maxillofacial structures were performed using the standard protocol without intravenous contrast. Multiplanar CT image reconstructions of the cervical spine and maxillofacial structures  were also generated. COMPARISON:  None. FINDINGS: CT HEAD FINDINGS Brain: No evidence of acute infarction, hemorrhage, hydrocephalus, extra-axial collection or mass lesion/mass effect. Vascular: Atherosclerotic calcification of the carotid siphons. No hyperdense vessel. Skull: No calvarial fracture or suspicious osseous lesion. No scalp swelling or hematoma. Other: None CT MAXILLOFACIAL FINDINGS Osseous: No fracture of the bony orbits. Mildly comminuted bilateral nasal bone fractures. Nasal spines are intact. No mid face fractures are seen. The pterygoid plates are intact. The mandible is intact. Temporomandibular joints are normally aligned. No temporal bone fractures are identified. No fractured or avulsed teeth. Several absent teeth as well as multiple carious lesions and periapical lucencies of the maxillary and mandibular dentition. Orbits: The globes appear normal and symmetric. Symmetric appearance of the extraocular musculature and optic nerve sheath complexes. Normal caliber of the superior ophthalmic veins. Sinuses: Mild mural thickening in the maxillary sinuses and ethmoids. Mastoid air cells and middle ear cavities are predominantly clear. Ossicular chains are normally configured. Soft tissues: Minimal asymmetric left periorbital and malar soft tissue swelling. Mild thickening across the nasal bridge. No soft tissue gas or foreign body. CT CERVICAL SPINE FINDINGS Alignment: No stabilization collar in place at the time of imaging. There is straightening of the normal cervical lordosis which is likely positional prop in of the patient's head. Craniocervical and atlantoaxial articulations are maintained. No abnormal facet widening is seen. No jumped or perched facets. Skull base and vertebrae: No acute fracture. No primary bone lesion or focal pathologic process. Degenerative ossification is noted at the tip of the dens. Likely degenerative vertebral body height loss at C4 and C5. No suspicious osseous  lesions. Soft tissues and spinal canal: No pre or paravertebral fluid or swelling. No visible canal hematoma. Disc levels: Mild multilevel intervertebral disc height loss with mild cervical spondylitic changes maximal C3-C6 without significant canal stenosis or foraminal narrowing the cervical spine. Upper chest: No acute abnormality in the upper chest or imaged lung apices. Other: None IMPRESSION: 1. No acute intracranial abnormality. No calvarial fracture or scalp hematoma. 2. Mildly comminuted bilateral nasal bone fractures. Mild soft tissue swelling. 3. Minimal left periorbital and malar soft tissue swelling. No other facial bone fracture. 4. Multiple carious lesions and periapical lucencies. Correlate with dental exam. 5. No acute cervical spine fracture or traumatic listhesis. Minimal cervical spondylitic changes. Electronically Signed   By: Kreg Shropshire M.D.   On: 02/24/2019 17:59   Ct Thoracic Spine Wo Contrast  Result Date: 02/24/2019 CLINICAL DATA:  Fall from a forefoot bowel continue to right side, right flank and neck pain EXAM: CT THORACIC AND LUMBAR SPINE WITHOUT CONTRAST TECHNIQUE: Multiplanar thin slice reconstructions of the thoracic and lumbar spine were generated from the contemporary CT of the chest, abdomen and pelvis. COMPARISON:  Contemporary CT of the  chest, abdomen and pelvis from which is study is generated FINDINGS: CT THORACIC SPINE FINDINGS Alignment: 12 rib-bearing thoracic type vertebral bodies. Preservation of the normal thoracic kyphosis. Mild dextrocurvature of the thoracic spine may be positional. No traumatic listhesis. Vertebrae: No vertebral body fracture or height loss. No suspicious osseous lesions. Mild enthesopathic changes noted at the supraspinous insertion upon the spinous processes. Paraspinal and other soft tissues: No paraspinal fluid or swelling. No visible canal hematoma. No vertebral body paraspinal musculature is normal quality in bulk. Findings in the chest,  please see dedicated CT of the chest from which is studies generated. Disc levels: Mild multilevel intervertebral disc height loss with early discogenic changes of the thoracic spine maximal T3-T9. No significant canal stenosis or neural foraminal narrowing is evident. CT LUMBAR SPINE FINDINGS Segmentation: 5 lumbar type vertebral bodies. Alignment: Preservation of the normal lumbar lordosis. Mild retrolisthesis of L5 on S1 by approximately 3 mm with some associated facet degenerative changes. No spondylolysis or traumatic listhesis. Posterior elements remain normally aligned. Vertebrae: Vertebral bodies are normally formed. No acute fracture or vertebral body height loss. No suspicious osseous lesions. Mild degenerative changes are noted in the SI joints with arthrosis at the superior right SI joint. Included portions of the sacrum are intact. Paraspinal and other soft tissues: No paraspinal fluid or soft tissue thickening. The paraspinal musculature is normal quality in bulk. No visible canal hematoma. Disc levels: Mild disc bulging at L4-5 and L5-S1 with retrolisthesis of L5 on S1 as detailed above. No significant spinal canal stenosis. Mild facet degenerative changes are present L4-5 and L5-S1 as well with at most mild bilateral foraminal narrowing at these levels. No other significant foraminal stenosis. IMPRESSION: CT THORACIC SPINE IMPRESSION 1. No acute fracture or traumatic listhesis. 2. Minimal discogenic changes in the thoracic spine, as detailed above. 3. Mild dextrocurvature of the thoracic spine, possibly positional. CT LUMBAR SPINE IMPRESSION 1. No acute fracture or traumatic listhesis. 2. 3 mm of retrolisthesis L5 on S1 likely on a degenerative basis. 3. Mild disc bulges at L4-5 and L5-S1 without significant canal stenosis. 4. Facet degenerative changes L4-5 and L5-S1 with mild bilateral neural foraminal narrowing. No other significant foraminal narrowing. For full findings in the chest, abdomen and  pelvis, please see dedicated report of the study from which this imaging is generated Electronically Signed   By: Lovena Le M.D.   On: 02/24/2019 18:11   Ct Abdomen Pelvis W Contrast  Result Date: 02/24/2019 CLINICAL DATA:  Blunt trauma. Fall from balcony onto right side. Right flank pain. Right neck pain. EXAM: CT CHEST, ABDOMEN, AND PELVIS WITH CONTRAST TECHNIQUE: Multidetector CT imaging of the chest, abdomen and pelvis was performed following the standard protocol during bolus administration of intravenous contrast. CONTRAST:  122mL OMNIPAQUE IOHEXOL 300 MG/ML  SOLN COMPARISON:  None. FINDINGS: CT CHEST FINDINGS Cardiovascular: Normal heart size. No significant pericardial fluid/thickening. Great vessels are normal in course and caliber. No evidence of acute thoracic aortic injury. No central pulmonary emboli. Mediastinum/Nodes: No pneumomediastinum. No mediastinal hematoma. No discrete thyroid nodules. Unremarkable esophagus. No axillary, mediastinal or hilar lymphadenopathy. Lungs/Pleura: No pneumothorax. No pleural effusion. No acute consolidative airspace disease, lung masses or significant pulmonary nodules. No pneumatoceles. Musculoskeletal: No aggressive appearing focal osseous lesions. No fracture detected in the chest. Minimal thoracic spondylosis. CT ABDOMEN PELVIS FINDINGS Hepatobiliary: Normal liver with no liver laceration or mass. Normal gallbladder with no radiopaque cholelithiasis. No biliary ductal dilatation. Pancreas: Normal, with no laceration, mass or duct dilation.  Spleen: Normal size. No laceration or mass. Adrenals/Urinary Tract: Normal adrenals. No hydronephrosis. No renal laceration. No renal mass. Normal bladder. Stomach/Bowel: Grossly normal stomach. Normal caliber small bowel with no small bowel wall thickening. Normal appendix. Mild sigmoid diverticulosis, with no large bowel wall thickening or significant pericolonic fat stranding. Vascular/Lymphatic: Atherosclerotic  nonaneurysmal abdominal aorta. No evidence of acute abdominal aortic injury. No pathologically enlarged lymph nodes in the abdomen or pelvis. Reproductive: Top-normal size prostate. Other: No pneumoperitoneum, ascites or focal fluid collection. Musculoskeletal: No aggressive appearing focal osseous lesions. No fracture in the abdomen or pelvis. IMPRESSION: No acute traumatic injury in the chest, abdomen or pelvis. Mild sigmoid diverticulosis. Aortic Atherosclerosis (ICD10-I70.0). Electronically Signed   By: Delbert Phenix M.D.   On: 02/24/2019 18:00   Ct L-spine No Charge  Result Date: 02/24/2019 CLINICAL DATA:  Fall from a forefoot bowel continue to right side, right flank and neck pain EXAM: CT THORACIC AND LUMBAR SPINE WITHOUT CONTRAST TECHNIQUE: Multiplanar thin slice reconstructions of the thoracic and lumbar spine were generated from the contemporary CT of the chest, abdomen and pelvis. COMPARISON:  Contemporary CT of the chest, abdomen and pelvis from which is study is generated FINDINGS: CT THORACIC SPINE FINDINGS Alignment: 12 rib-bearing thoracic type vertebral bodies. Preservation of the normal thoracic kyphosis. Mild dextrocurvature of the thoracic spine may be positional. No traumatic listhesis. Vertebrae: No vertebral body fracture or height loss. No suspicious osseous lesions. Mild enthesopathic changes noted at the supraspinous insertion upon the spinous processes. Paraspinal and other soft tissues: No paraspinal fluid or swelling. No visible canal hematoma. No vertebral body paraspinal musculature is normal quality in bulk. Findings in the chest, please see dedicated CT of the chest from which is studies generated. Disc levels: Mild multilevel intervertebral disc height loss with early discogenic changes of the thoracic spine maximal T3-T9. No significant canal stenosis or neural foraminal narrowing is evident. CT LUMBAR SPINE FINDINGS Segmentation: 5 lumbar type vertebral bodies. Alignment:  Preservation of the normal lumbar lordosis. Mild retrolisthesis of L5 on S1 by approximately 3 mm with some associated facet degenerative changes. No spondylolysis or traumatic listhesis. Posterior elements remain normally aligned. Vertebrae: Vertebral bodies are normally formed. No acute fracture or vertebral body height loss. No suspicious osseous lesions. Mild degenerative changes are noted in the SI joints with arthrosis at the superior right SI joint. Included portions of the sacrum are intact. Paraspinal and other soft tissues: No paraspinal fluid or soft tissue thickening. The paraspinal musculature is normal quality in bulk. No visible canal hematoma. Disc levels: Mild disc bulging at L4-5 and L5-S1 with retrolisthesis of L5 on S1 as detailed above. No significant spinal canal stenosis. Mild facet degenerative changes are present L4-5 and L5-S1 as well with at most mild bilateral foraminal narrowing at these levels. No other significant foraminal stenosis. IMPRESSION: CT THORACIC SPINE IMPRESSION 1. No acute fracture or traumatic listhesis. 2. Minimal discogenic changes in the thoracic spine, as detailed above. 3. Mild dextrocurvature of the thoracic spine, possibly positional. CT LUMBAR SPINE IMPRESSION 1. No acute fracture or traumatic listhesis. 2. 3 mm of retrolisthesis L5 on S1 likely on a degenerative basis. 3. Mild disc bulges at L4-5 and L5-S1 without significant canal stenosis. 4. Facet degenerative changes L4-5 and L5-S1 with mild bilateral neural foraminal narrowing. No other significant foraminal narrowing. For full findings in the chest, abdomen and pelvis, please see dedicated report of the study from which this imaging is generated Electronically Signed   By: Samuella Cota  [facility] M.D.   On: 02/24/2019 18:11   Ct Maxillofacial Wo Contrast  Result Date: 02/24/2019 CLINICAL DATA:  Larey Seat from a 4 ft balcony to right side EXAM: CT HEAD WITHOUT CONTRAST CT MAXILLOFACIAL WITHOUT CONTRAST CT CERVICAL  SPINE WITHOUT CONTRAST TECHNIQUE: Multidetector CT imaging of the head, cervical spine, and maxillofacial structures were performed using the standard protocol without intravenous contrast. Multiplanar CT image reconstructions of the cervical spine and maxillofacial structures were also generated. COMPARISON:  None. FINDINGS: CT HEAD FINDINGS Brain: No evidence of acute infarction, hemorrhage, hydrocephalus, extra-axial collection or mass lesion/mass effect. Vascular: Atherosclerotic calcification of the carotid siphons. No hyperdense vessel. Skull: No calvarial fracture or suspicious osseous lesion. No scalp swelling or hematoma. Other: None CT MAXILLOFACIAL FINDINGS Osseous: No fracture of the bony orbits. Mildly comminuted bilateral nasal bone fractures. Nasal spines are intact. No mid face fractures are seen. The pterygoid plates are intact. The mandible is intact. Temporomandibular joints are normally aligned. No temporal bone fractures are identified. No fractured or avulsed teeth. Several absent teeth as well as multiple carious lesions and periapical lucencies of the maxillary and mandibular dentition. Orbits: The globes appear normal and symmetric. Symmetric appearance of the extraocular musculature and optic nerve sheath complexes. Normal caliber of the superior ophthalmic veins. Sinuses: Mild mural thickening in the maxillary sinuses and ethmoids. Mastoid air cells and middle ear cavities are predominantly clear. Ossicular chains are normally configured. Soft tissues: Minimal asymmetric left periorbital and malar soft tissue swelling. Mild thickening across the nasal bridge. No soft tissue gas or foreign body. CT CERVICAL SPINE FINDINGS Alignment: No stabilization collar in place at the time of imaging. There is straightening of the normal cervical lordosis which is likely positional prop in of the patient's head. Craniocervical and atlantoaxial articulations are maintained. No abnormal facet widening is  seen. No jumped or perched facets. Skull base and vertebrae: No acute fracture. No primary bone lesion or focal pathologic process. Degenerative ossification is noted at the tip of the dens. Likely degenerative vertebral body height loss at C4 and C5. No suspicious osseous lesions. Soft tissues and spinal canal: No pre or paravertebral fluid or swelling. No visible canal hematoma. Disc levels: Mild multilevel intervertebral disc height loss with mild cervical spondylitic changes maximal C3-C6 without significant canal stenosis or foraminal narrowing the cervical spine. Upper chest: No acute abnormality in the upper chest or imaged lung apices. Other: None IMPRESSION: 1. No acute intracranial abnormality. No calvarial fracture or scalp hematoma. 2. Mildly comminuted bilateral nasal bone fractures. Mild soft tissue swelling. 3. Minimal left periorbital and malar soft tissue swelling. No other facial bone fracture. 4. Multiple carious lesions and periapical lucencies. Correlate with dental exam. 5. No acute cervical spine fracture or traumatic listhesis. Minimal cervical spondylitic changes. Electronically Signed   By: Kreg Shropshire M.D.   On: 02/24/2019 17:59    Procedures Procedures (including critical care time)  Medications Ordered in ED Medications  oxyCODONE-acetaminophen (PERCOCET/ROXICET) 5-325 MG per tablet 1 tablet (1 tablet Oral Given 02/24/19 1447)  iohexol (OMNIPAQUE) 300 MG/ML solution 100 mL (100 mLs Intravenous Contrast Given 02/24/19 1626)  HYDROmorphone (DILAUDID) injection 1 mg (1 mg Intravenous Given 02/24/19 1824)     Initial Impression / Assessment and Plan / ED Course  I have reviewed the triage vital signs and the nursing notes.  Pertinent labs & imaging results that were available during my care of the patient were reviewed by me and considered in my medical decision making (see chart for  details).       53 year old male presents to ED after a mechanical fall from roughly  196ft. Vitals reviewed and all within normal limits except mild elevated BP of 142/84. Patient in no acute distress and non-toxic appearing. Midline tenderness in cervical and lumbar regions. He is able to ambulate in ED. Tenderness around left orbit. PERRL. EOMs intact bilaterally. Left arm birth deformity. Given the nature of the trauma will pan scan to rule out intra-abdominal, bony fractures, intracranial hemorrhage, and chest injuries. Will order routine labs.  CBC and CMP reassuring. All images personally reviewed which demonstrates bilateral nasal fracture, but all other images unremarkable for emergent conditions or bony fractures. Suspect pain related to muscle etiology. Discussed results with patient. Will treat symptomatically with Meloxicam and Robaxin. Patient given a few Percocets for breakthrough pain. Reviewed [place] drug database prior to prescribing. Patient advised to follow up with PCP if symptoms do not improve within the next week. ENT number given to patient if issues worsen with nasal fracture. Strict ED precautions discussed with patient. Patient states understanding and agrees to plan. Patient discharged home in no acute distress.   Discussed case with Dr. Effie ShyWentz who evaluated patient and agrees with assessment and plan.   Final Clinical Impressions(s) / ED Diagnoses   Final diagnoses:  Fall  Minor head injury, initial encounter    ED Discharge Orders         Ordered    methocarbamol (ROBAXIN) 500 MG tablet  2 times daily,   Status:  Discontinued     02/24/19 1830    meloxicam (MOBIC) 15 MG tablet  Daily,   Status:  Discontinued     02/24/19 1830    HYDROcodone-acetaminophen (NORCO/VICODIN) 5-325 MG tablet  Every 6 hours PRN,   Status:  Discontinued     02/24/19 1830    HYDROcodone-acetaminophen (NORCO/VICODIN) 5-325 MG tablet  Every 6 hours PRN,   Status:  Discontinued     02/24/19 1832    meloxicam (MOBIC) 15 MG tablet  Daily     02/24/19 1832    methocarbamol (ROBAXIN)  500 MG tablet  2 times daily     02/24/19 1832    oxyCODONE-acetaminophen (PERCOCET/ROXICET) 5-325 MG tablet  Every 6 hours PRN     02/24/19 1844           Renee HarderCheek, Feliz Lincoln B, PA-C 02/24/19 2257    Renee Harderheek, Henlee Donovan B, PA-C 02/25/19 [id number]    Mancel BaleWentz, Elliott, MD 02/26/19 1313

## 2019-02-24 NOTE — ED Notes (Signed)
Patient transported to CT 

## 2019-02-24 NOTE — Discharge Instructions (Addendum)
I am sending you home with 3 medications. Meloxicam, Norco, and Robaxin. I recommend taking Meloxicam once a day for the next few days to help with pain and inflammation. Norco is an opioid and should only be used for severe pain. Both Robaxin and Norco can make you drowsy, so do not drive or operate machinery while on the medication. Do not mix medications with other over the counter medicines. I have given you the number of the ENT doctor. If you start to experience issues with your broken nose call them to schedule an appointment. If you pain does not improve within the next week, follow up with your PCP. Return to the ER for new or worsening symptoms.

## 2020-05-06 ENCOUNTER — Ambulatory Visit (INDEPENDENT_AMBULATORY_CARE_PROVIDER_SITE_OTHER): Payer: Medicare Other

## 2020-05-06 ENCOUNTER — Other Ambulatory Visit: Payer: Self-pay

## 2020-05-06 ENCOUNTER — Ambulatory Visit (HOSPITAL_COMMUNITY)
Admission: EM | Admit: 2020-05-06 | Discharge: 2020-05-06 | Disposition: A | Discharge status: Home / Self Care | Payer: Medicare Other | Attending: Medical Oncology | Admitting: Medical Oncology

## 2020-05-06 ENCOUNTER — Encounter (HOSPITAL_COMMUNITY): Payer: Self-pay | Admitting: Emergency Medicine

## 2020-05-06 DIAGNOSIS — M25562 Pain in left knee: Secondary | ICD-10-CM | POA: Diagnosis not present

## 2020-05-06 DIAGNOSIS — W19XXXA Unspecified fall, initial encounter: Secondary | ICD-10-CM

## 2020-05-06 MED ORDER — KETOROLAC TROMETHAMINE 30 MG/ML IJ SOLN: 30.0000 mg | Freq: Once | INTRAMUSCULAR | Status: DC

## 2020-05-06 NOTE — ED Notes (Signed)
t refused IM injection for pain he has oxy at home for pain and Pt

## 2020-05-06 NOTE — ED Provider Notes (Signed)
[facility]    CSN: [id number] Arrival date & time: 05/06/20  1253      History   Chief Complaint Chief Complaint  Patient presents with  . Fall  . Knee Pain    left    HPI Jack Bennett is a 55 y.o. male.   HPI  Knee Pain: Pt reports that he had a fall last night. He did not hit his head or have LOC from the fall but does have left knee pain.  He states that the pain is a 10 on a 10 and is both sharp and dull in nature.  Pain worsens with range of motion of the knee.  He reports that he has not taken anything for pain.  No peripheral edema, skin color changes of the lower leg or numbness or tingling.  He ambulates using one crutch but asked for a new set as he had to borrow today's crutches.  Of note patient is followed by pain management.  History reviewed. No pertinent past medical history.  There are no problems to display for this patient.   Past Surgical History:  Procedure Laterality Date  . arm surgery         Home Medications    Prior to Admission medications   Medication Sig Start Date End Date Taking? Authorizing Provider  oxyCODONE-acetaminophen (PERCOCET/ROXICET) 5-325 MG tablet Take 1 tablet by mouth every 6 (six) hours as needed for severe pain. 02/24/19  Yes Aberman, Merla Riches, PA-C  meloxicam (MOBIC) 15 MG tablet Take 1 tablet (15 mg total) by mouth daily. 02/24/19   Mannie Stabile, PA-C    Family History Family History  Problem Relation Age of Onset  . Hypertension Other   . Diabetes Other   . Cancer Other     Social History Social History   Tobacco Use  . Smoking status: Current Every Day Smoker    Packs/day: 0.50    Types: Cigarettes  . Smokeless tobacco: Never Used  Substance Use Topics  . Alcohol use: Yes    Comment: occ  . Drug use: Yes    Types: Marijuana     Allergies   Fish-derived products   Review of Systems Review of Systems   Physical Exam Triage Vital Signs ED Triage Vitals  Enc Vitals  Group     BP 05/06/20 1308 127/71     Pulse Rate 05/06/20 1308 76     Resp 05/06/20 1308 18     Temp 05/06/20 1308 97.8 F (36.6 C)     Temp Source 05/06/20 1308 Oral     SpO2 05/06/20 1308 98 %     Weight 05/06/20 1305 210 lb (95.3 kg)     Height 05/06/20 1305 6\' 1"  (1.854 m)     Head Circumference --      Peak Flow --      Pain Score 05/06/20 1304 10     Pain Loc --      Pain Edu? --      Excl. in GC? --    No data found.  Updated Vital Signs BP 127/71 (BP Location: Right Arm)   Pulse 76   Temp 97.8 F (36.6 C) (Oral)   Resp 18   Ht 6\' 1"  (1.854 m)   Wt 210 lb (95.3 kg)   SpO2 98%   BMI 27.71 kg/m   Physical Exam Vitals and nursing note reviewed.  Musculoskeletal:     Comments: Left knee range of motion decreased  due to pain.  Anterior and posterior drawer testing is negative.  Normal varus and valgus stress testing of the left knee.  No palpable effusion and no visual edema.  No evidence of skin breakdown or ecchymosis.  No erythema or skin breakdown.  No abnormalities of the right knee.  Skin:    General: Skin is warm.     Coloration: Skin is not jaundiced.     Findings: No bruising.  Neurological:     General: No focal deficit present.     Mental Status: He is oriented to person, place, and time.     Sensory: No sensory deficit.     Coordination: Coordination normal.    UC Treatments / Results  Labs (all labs ordered are listed, but only abnormal results are displayed) Labs Reviewed - No data to display  Radiology No results found.  Procedures Procedures (including critical care time)  Medications Ordered in UC Medications - No data to display  Initial Impression / Assessment and Plan / UC Course  I have reviewed the triage vital signs and the nursing notes.  Pertinent labs & imaging results that were available during my care of the patient were reviewed by me and considered in my medical decision making (see chart for details).     New.  X-ray  pending.  Toradol injection discussed and administered to patient today to help assist with his pain.  Given his establishment with pain management he will need to discuss further pain medication with them proceeding forward but he can continue to take ibuprofen as needed and directed on the bottle.  Red flag symptoms discussed.    Final Clinical Impressions(s) / UC Diagnoses   Final diagnoses:  Acute pain of left knee  Fall, initial encounter   Discharge Instructions   None    ED Prescriptions    None     PDMP not reviewed this encounter.   Rushie Chestnut, [place] 05/06/20 1416

## 2020-05-06 NOTE — ED Triage Notes (Signed)
Pt presents today with left knee pain. He reports falling down steps last night. Ambulates with use of one crutch.

## 2021-09-03 IMAGING — CT CT CERVICAL SPINE W/O CM
3 of 4 series · 9 of 33 positions shown, 10 images · non-contrast
Comparison: None.

CLINICAL DATA: Fell from a 4 ft balcony to right side

EXAM:
CT HEAD WITHOUT CONTRAST
CT MAXILLOFACIAL WITHOUT CONTRAST
CT CERVICAL SPINE WITHOUT CONTRAST
TECHNIQUE: Multidetector CT imaging of the head, cervical spine, and
maxillofacial structures were performed using the standard protocol
without intravenous contrast. Multiplanar CT image reconstructions
of the cervical spine and maxillofacial structures were also
generated.

[Series 6: orthogonal axials · axial · 0.23mm/px · z∈[-172,-172]mm · 1 of 125 slices shown, 2 images]
[im 71/125  soft-tissue]
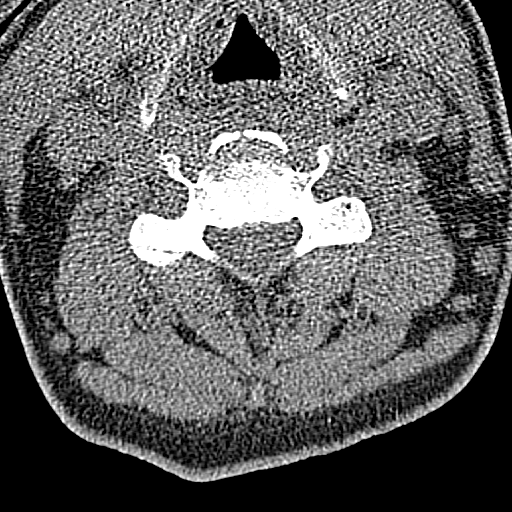
[im 71/125  bone]
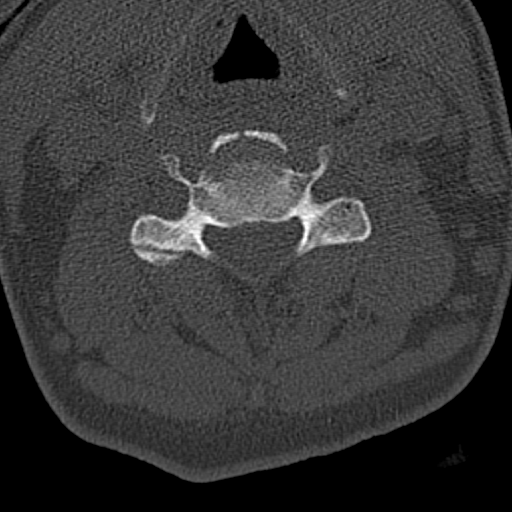

[Series 7: coronal bone · coronal · 0.23mm/px · 3 of 61 slices shown]
[im 17/61  bone]
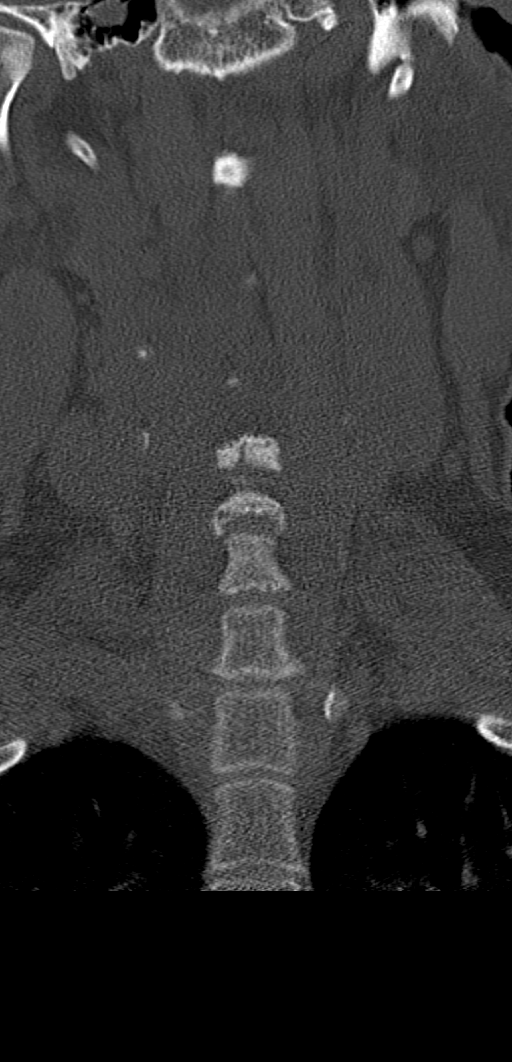
[im 26/61  bone]
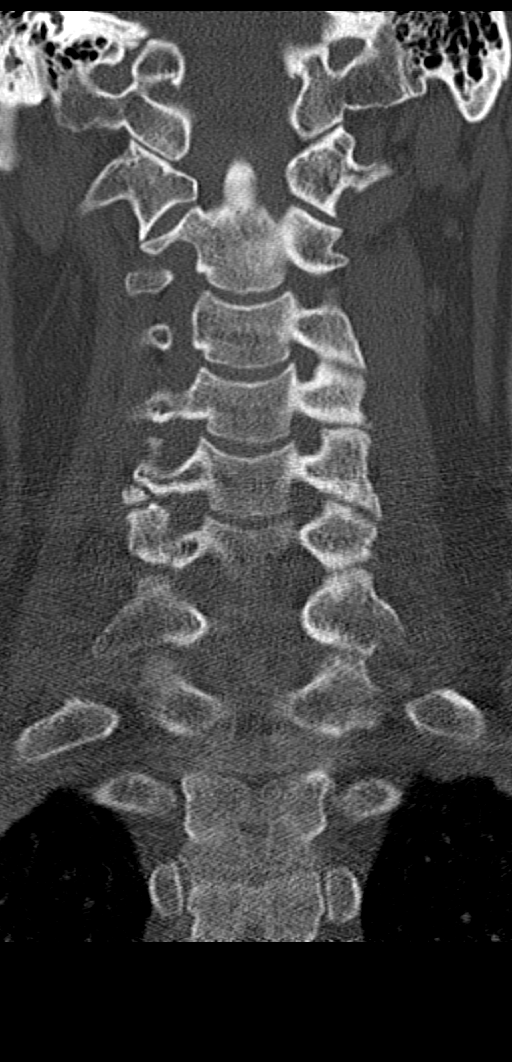
[im 35/61  bone]
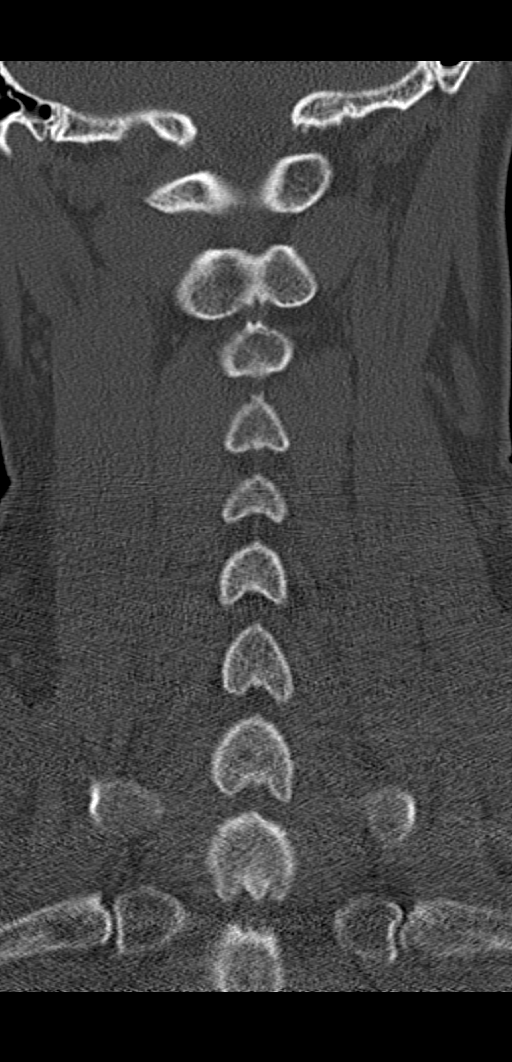

[Series 8: sagittal bone · sagittal · 0.23mm/px · 5 of 61 slices shown]
[im 21/61  bone]
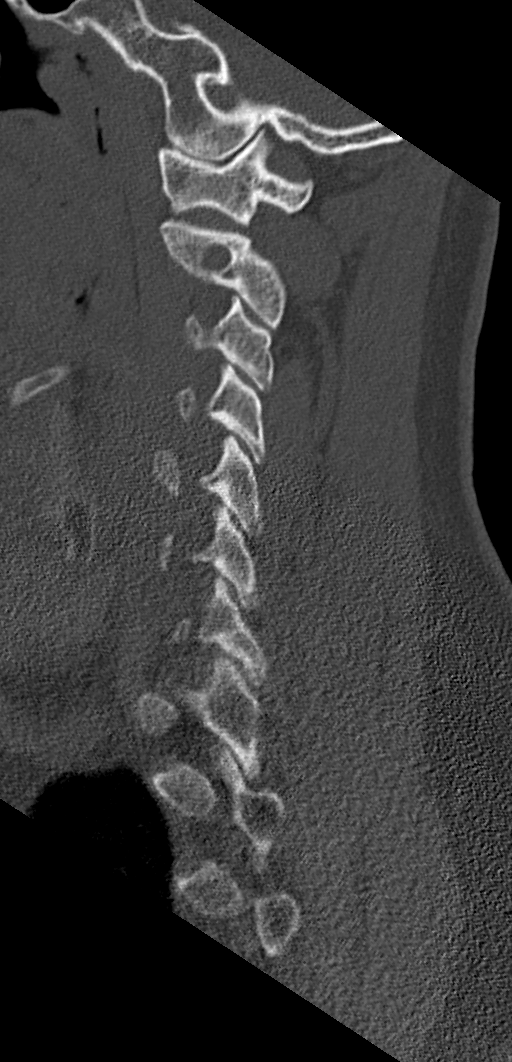
[im 26/61  bone]
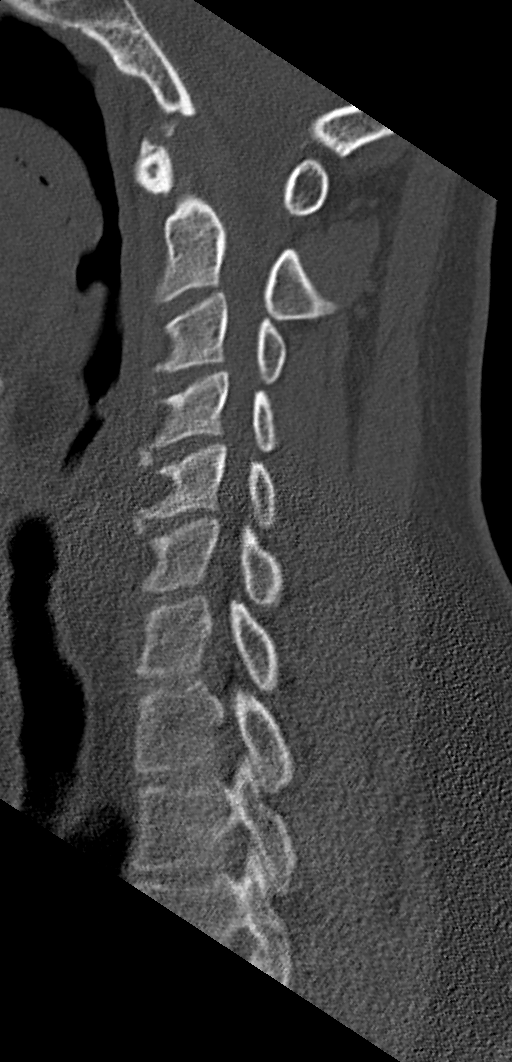
[im 31/61  bone]
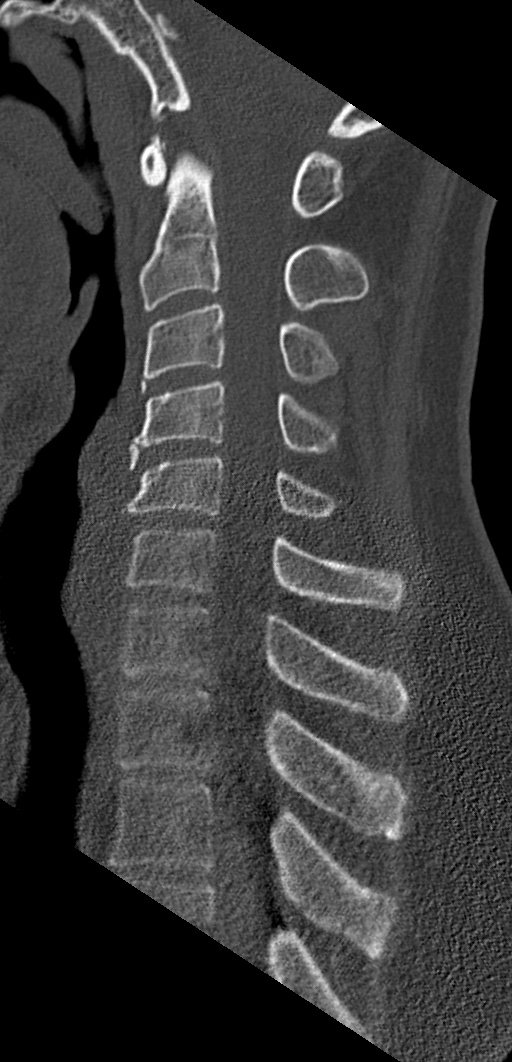
[im 36/61  bone]
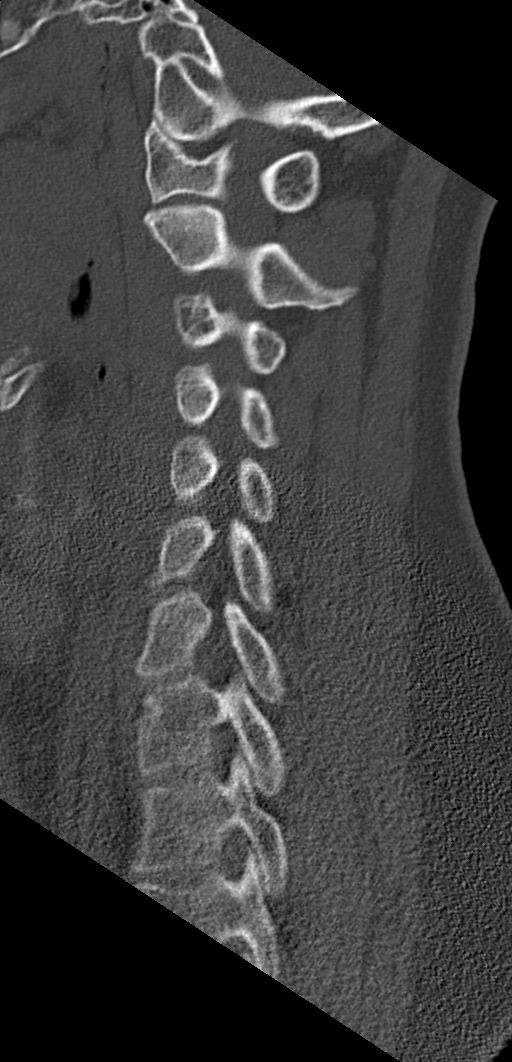
[im 41/61  bone]
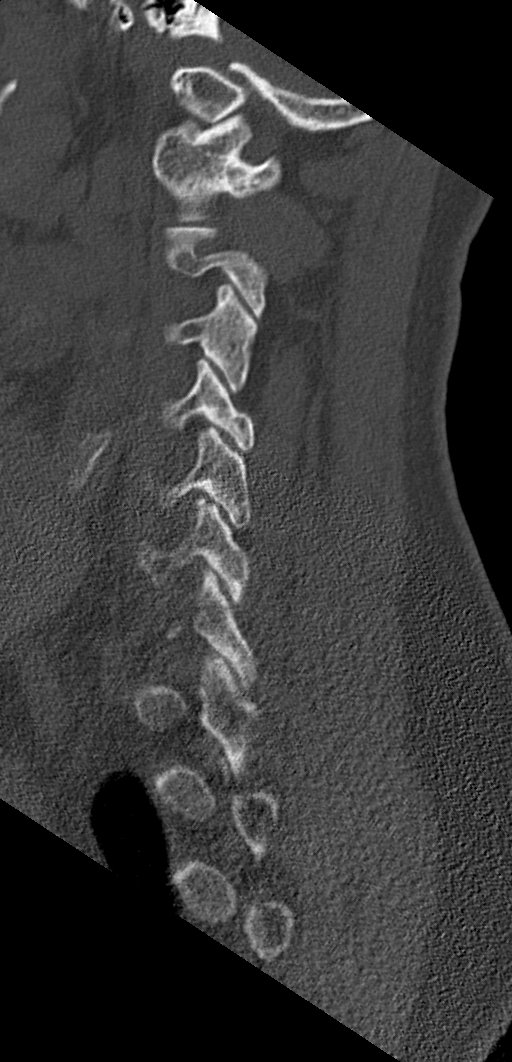

[9 of 33 positions shown; findings below may reference images not displayed]

FINDINGS: CT HEAD FINDINGS

Brain: No evidence of acute infarction, hemorrhage, hydrocephalus,
extra-axial collection or mass lesion/mass effect.

Vascular: Atherosclerotic calcification of the carotid siphons. No
hyperdense vessel.

Skull: No calvarial fracture or suspicious osseous lesion. No scalp
swelling or hematoma.

Other: None

CT MAXILLOFACIAL FINDINGS

Osseous: No fracture of the bony orbits. Mildly comminuted bilateral
nasal bone fractures. Nasal spines are intact. No mid face fractures
are seen. The pterygoid plates are intact. The mandible is intact.
Temporomandibular joints are normally aligned. No temporal bone
fractures are identified. No fractured or avulsed teeth. Several
absent teeth as well as multiple carious lesions and periapical
lucencies of the maxillary and mandibular dentition.

Orbits: The globes appear normal and symmetric. Symmetric appearance
of the extraocular musculature and optic nerve sheath complexes.
Normal caliber of the superior ophthalmic veins.

Sinuses: Mild mural thickening in the maxillary sinuses and
ethmoids. Mastoid air cells and middle ear cavities are
predominantly clear. Ossicular chains are normally configured.

Soft tissues: Minimal asymmetric left periorbital and malar soft
tissue swelling. Mild thickening across the nasal bridge. No soft
tissue gas or foreign body.

CT CERVICAL SPINE FINDINGS

Alignment: No stabilization collar in place at the time of imaging.
There is straightening of the normal cervical lordosis which is
likely positional prop in of the patient's head. Craniocervical and
atlantoaxial articulations are maintained. No abnormal facet
widening is seen. No jumped or perched facets.

Skull base and vertebrae: No acute fracture. No primary bone lesion
or focal pathologic process. Degenerative ossification is noted at
the tip of the dens. Likely degenerative vertebral body height loss
at C4 and C5. No suspicious osseous lesions.

Soft tissues and spinal canal: No pre or paravertebral fluid or
swelling. No visible canal hematoma.

Disc levels: Mild multilevel intervertebral disc height loss with
mild cervical spondylitic changes maximal C3-C6 without significant
canal stenosis or foraminal narrowing the cervical spine.

Upper chest: No acute abnormality in the upper chest or imaged lung
apices.

Other: None
IMPRESSION: 1. No acute intracranial abnormality. No calvarial fracture or scalp
hematoma.
2. Mildly comminuted bilateral nasal bone fractures. Mild soft
tissue swelling.
3. Minimal left periorbital and malar soft tissue swelling. No other
facial bone fracture.
4. Multiple carious lesions and periapical lucencies. Correlate with
dental exam.
5. No acute cervical spine fracture or traumatic listhesis. Minimal
cervical spondylitic changes.

## 2021-09-03 IMAGING — CT CT CHEST W/ CM
2 of 5 series · 13 of 36 positions shown, 16 images · IV contrast (omnipaque)
Comparison: None.

CLINICAL DATA: Blunt trauma. Fall from balcony onto right side.
Right flank pain. Right neck pain.

EXAM:
CT CHEST, ABDOMEN, AND PELVIS WITH CONTRAST
TECHNIQUE: Multidetector CT imaging of the chest, abdomen and pelvis was
performed following the standard protocol during bolus
administration of intravenous contrast.
CONTRAST:  100mL OMNIPAQUE IOHEXOL 300 MG/ML  SOLN

[Series 2: cap with · axial · 0.81mm/px · z∈[-773,-238]mm · 10 of 131 slices shown, 13 images]
[im 12/131  mediastinal]
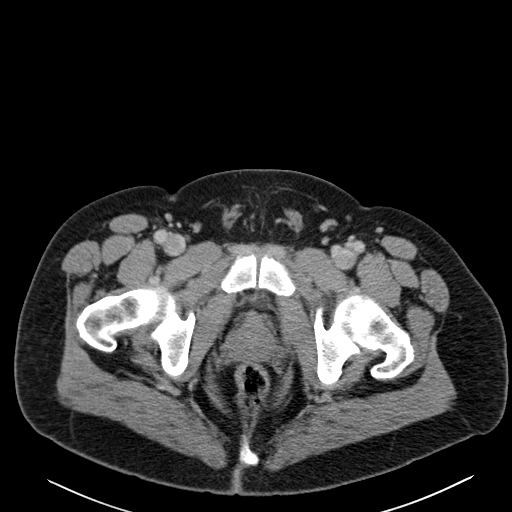
[im 12/131  lung]
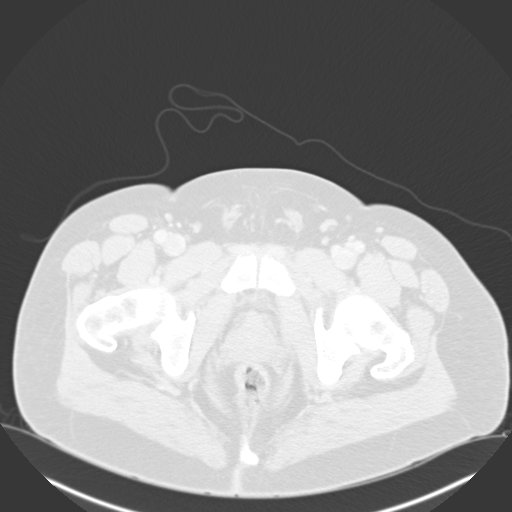
[im 24/131  lung]
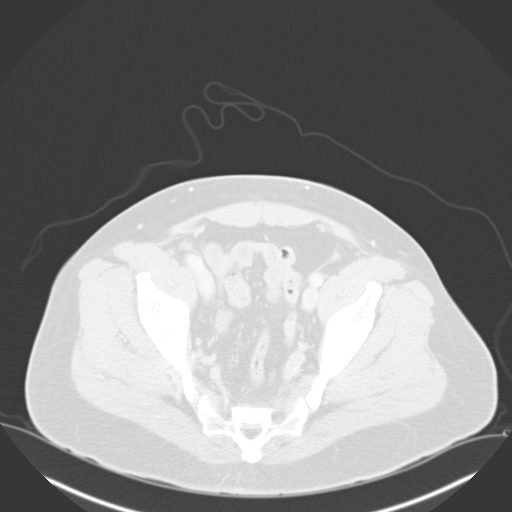
[im 36/131  lung]
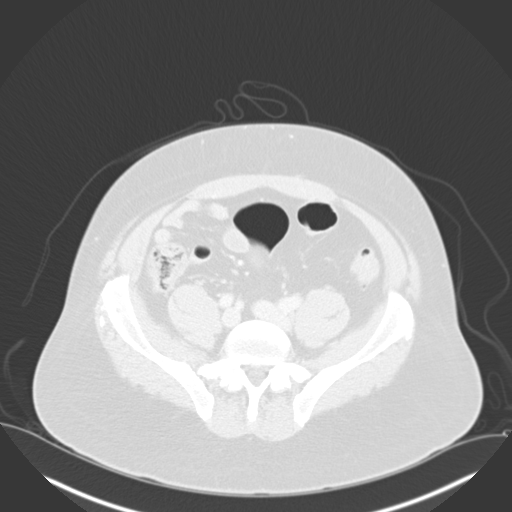
[im 48/131  lung]
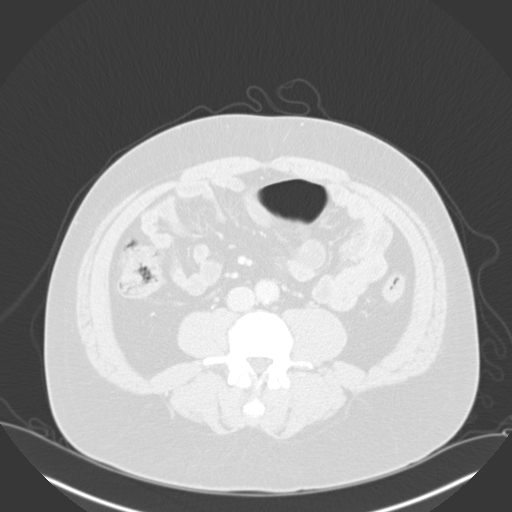
[im 60/131  mediastinal]
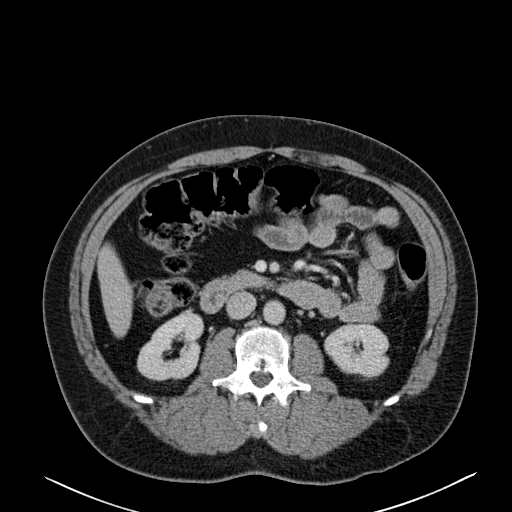
[im 60/131  lung]
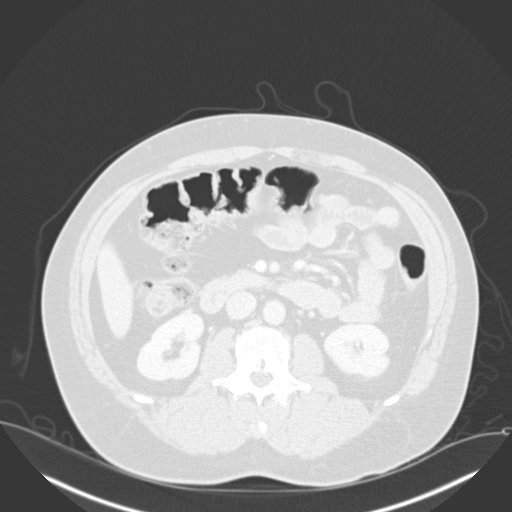
[im 71/131  lung]
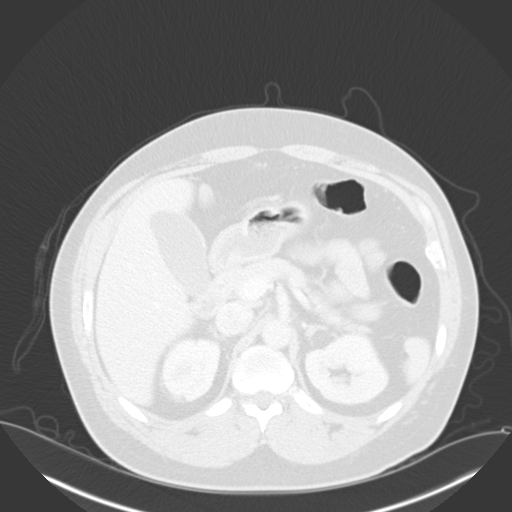
[im 83/131  lung]
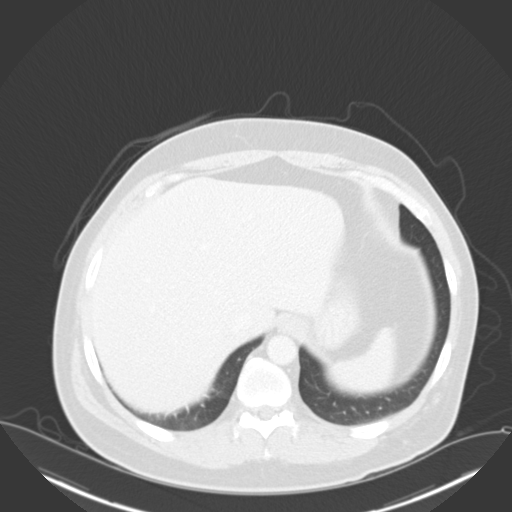
[im 95/131  lung]
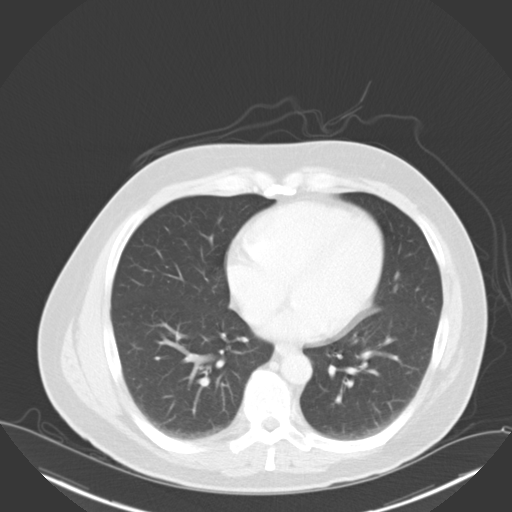
[im 107/131  mediastinal]
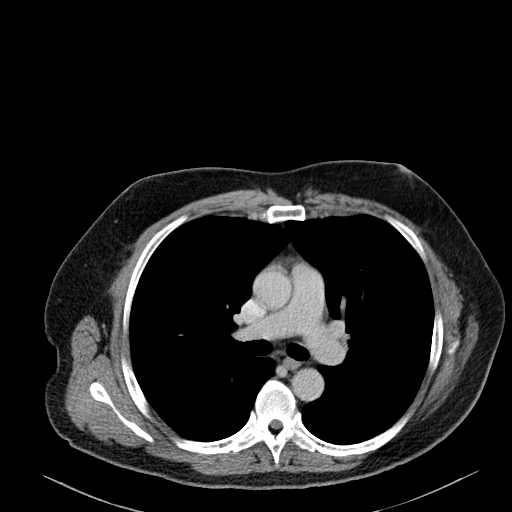
[im 107/131  lung]
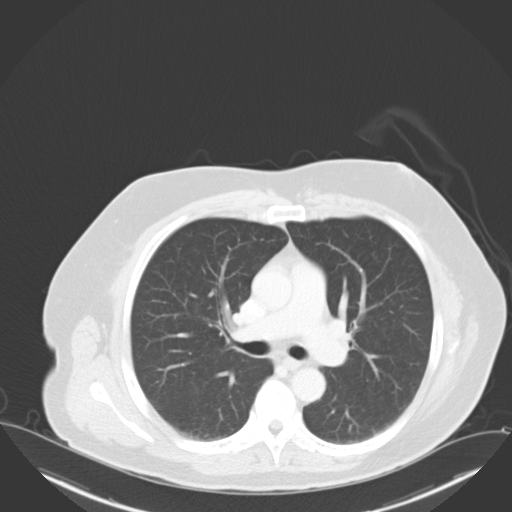
[im 119/131  lung]
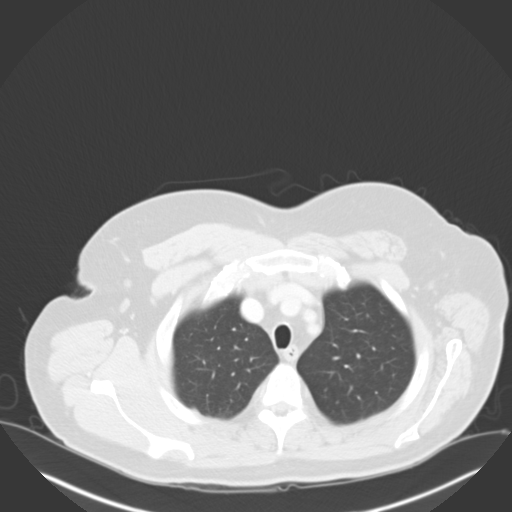

[Series 4: coronals · coronal · 0.71mm/px · 3 of 157 slices shown]
[im 32/157  lung]
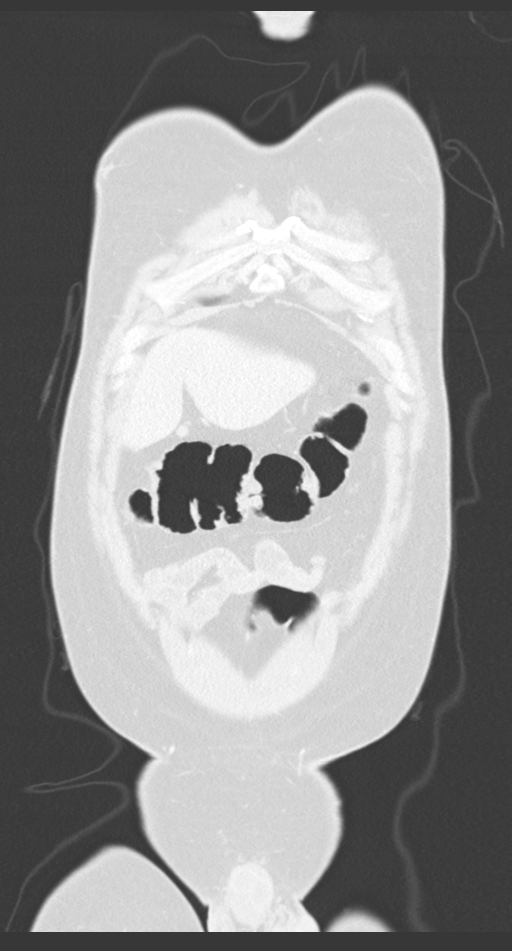
[im 63/157  lung]
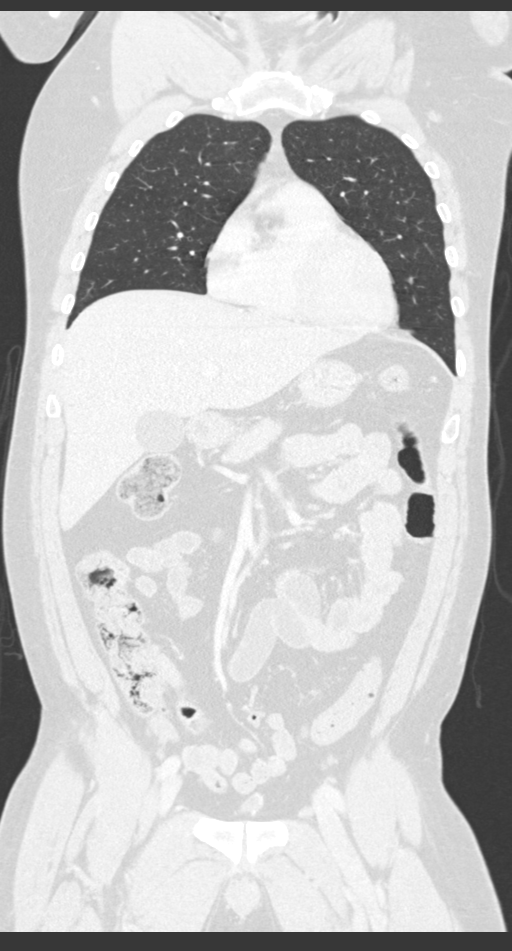
[im 94/157  lung]
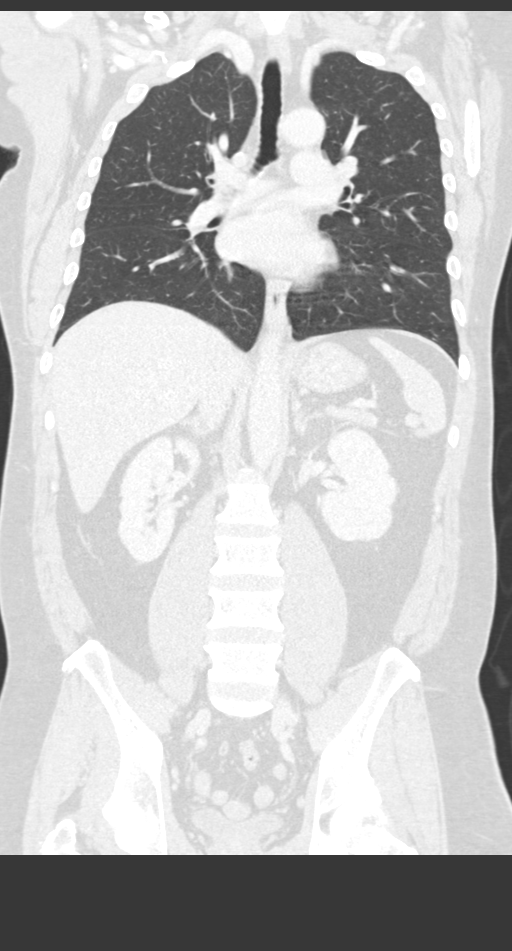

[13 of 36 positions shown; findings below may reference images not displayed]

FINDINGS: CT CHEST FINDINGS

Cardiovascular: Normal heart size. No significant pericardial
fluid/thickening. Great vessels are normal in course and caliber. No
evidence of acute thoracic aortic injury. No central pulmonary
emboli.

Mediastinum/Nodes: No pneumomediastinum. No mediastinal hematoma. No
discrete thyroid nodules. Unremarkable esophagus. No axillary,
mediastinal or hilar lymphadenopathy.

Lungs/Pleura: No pneumothorax. No pleural effusion. No acute
consolidative airspace disease, lung masses or significant pulmonary
nodules. No pneumatoceles.

Musculoskeletal: No aggressive appearing focal osseous lesions. No
fracture detected in the chest. Minimal thoracic spondylosis.

CT ABDOMEN PELVIS FINDINGS

Hepatobiliary: Normal liver with no liver laceration or mass. Normal
gallbladder with no radiopaque cholelithiasis. No biliary ductal
dilatation.

Pancreas: Normal, with no laceration, mass or duct dilation.

Spleen: Normal size. No laceration or mass.

Adrenals/Urinary Tract: Normal adrenals. No hydronephrosis. No renal
laceration. No renal mass. Normal bladder.

Stomach/Bowel: Grossly normal stomach. Normal caliber small bowel
with no small bowel wall thickening. Normal appendix. Mild sigmoid
diverticulosis, with no large bowel wall thickening or significant
pericolonic fat stranding.

Vascular/Lymphatic: Atherosclerotic nonaneurysmal abdominal aorta.
No evidence of acute abdominal aortic injury. No pathologically
enlarged lymph nodes in the abdomen or pelvis.

Reproductive: Top-normal size prostate.

Other: No pneumoperitoneum, ascites or focal fluid collection.

Musculoskeletal: No aggressive appearing focal osseous lesions. No
fracture in the abdomen or pelvis.
IMPRESSION: No acute traumatic injury in the chest, abdomen or pelvis.

Mild sigmoid diverticulosis.

Aortic Atherosclerosis ([id number]).

## 2021-10-01 DIAGNOSIS — F17210 Nicotine dependence, cigarettes, uncomplicated: Secondary | ICD-10-CM | POA: Diagnosis not present

## 2021-10-01 DIAGNOSIS — Z79899 Other long term (current) drug therapy: Secondary | ICD-10-CM | POA: Diagnosis not present

## 2021-10-01 DIAGNOSIS — M19019 Primary osteoarthritis, unspecified shoulder: Secondary | ICD-10-CM | POA: Diagnosis not present

## 2021-10-01 DIAGNOSIS — M545 Low back pain, unspecified: Secondary | ICD-10-CM | POA: Diagnosis not present

## 2021-10-01 DIAGNOSIS — M47818 Spondylosis without myelopathy or radiculopathy, sacral and sacrococcygeal region: Secondary | ICD-10-CM | POA: Diagnosis not present

## 2021-10-01 DIAGNOSIS — G8929 Other chronic pain: Secondary | ICD-10-CM | POA: Diagnosis not present

## 2021-11-01 DIAGNOSIS — M79672 Pain in left foot: Secondary | ICD-10-CM | POA: Diagnosis not present

## 2021-11-01 DIAGNOSIS — M19019 Primary osteoarthritis, unspecified shoulder: Secondary | ICD-10-CM | POA: Diagnosis not present

## 2021-11-01 DIAGNOSIS — R202 Paresthesia of skin: Secondary | ICD-10-CM | POA: Diagnosis not present

## 2021-11-01 DIAGNOSIS — F17210 Nicotine dependence, cigarettes, uncomplicated: Secondary | ICD-10-CM | POA: Diagnosis not present

## 2021-11-01 DIAGNOSIS — M79602 Pain in left arm: Secondary | ICD-10-CM | POA: Diagnosis not present

## 2021-11-01 DIAGNOSIS — M545 Low back pain, unspecified: Secondary | ICD-10-CM | POA: Diagnosis not present

## 2021-11-01 DIAGNOSIS — M24532 Contracture, left wrist: Secondary | ICD-10-CM | POA: Diagnosis not present

## 2021-11-01 DIAGNOSIS — G8929 Other chronic pain: Secondary | ICD-10-CM | POA: Diagnosis not present

## 2021-11-01 DIAGNOSIS — M47818 Spondylosis without myelopathy or radiculopathy, sacral and sacrococcygeal region: Secondary | ICD-10-CM | POA: Diagnosis not present

## 2021-11-01 DIAGNOSIS — Z79899 Other long term (current) drug therapy: Secondary | ICD-10-CM | POA: Diagnosis not present

## 2021-11-28 DIAGNOSIS — M79602 Pain in left arm: Secondary | ICD-10-CM | POA: Diagnosis not present

## 2021-11-28 DIAGNOSIS — Z79899 Other long term (current) drug therapy: Secondary | ICD-10-CM | POA: Diagnosis not present

## 2021-11-28 DIAGNOSIS — G8929 Other chronic pain: Secondary | ICD-10-CM | POA: Diagnosis not present

## 2021-11-28 DIAGNOSIS — M47818 Spondylosis without myelopathy or radiculopathy, sacral and sacrococcygeal region: Secondary | ICD-10-CM | POA: Diagnosis not present

## 2021-11-28 DIAGNOSIS — M545 Low back pain, unspecified: Secondary | ICD-10-CM | POA: Diagnosis not present

## 2021-11-28 DIAGNOSIS — F17210 Nicotine dependence, cigarettes, uncomplicated: Secondary | ICD-10-CM | POA: Diagnosis not present

## 2021-11-28 DIAGNOSIS — M79672 Pain in left foot: Secondary | ICD-10-CM | POA: Diagnosis not present

## 2021-11-28 DIAGNOSIS — M19019 Primary osteoarthritis, unspecified shoulder: Secondary | ICD-10-CM | POA: Diagnosis not present

## 2021-11-28 DIAGNOSIS — M24532 Contracture, left wrist: Secondary | ICD-10-CM | POA: Diagnosis not present

## 2022-01-02 DIAGNOSIS — M79672 Pain in left foot: Secondary | ICD-10-CM | POA: Diagnosis not present

## 2022-01-02 DIAGNOSIS — G8929 Other chronic pain: Secondary | ICD-10-CM | POA: Diagnosis not present

## 2022-01-02 DIAGNOSIS — F17210 Nicotine dependence, cigarettes, uncomplicated: Secondary | ICD-10-CM | POA: Diagnosis not present

## 2022-01-02 DIAGNOSIS — M24532 Contracture, left wrist: Secondary | ICD-10-CM | POA: Diagnosis not present

## 2022-01-02 DIAGNOSIS — M79602 Pain in left arm: Secondary | ICD-10-CM | POA: Diagnosis not present

## 2022-01-02 DIAGNOSIS — M545 Low back pain, unspecified: Secondary | ICD-10-CM | POA: Diagnosis not present

## 2022-01-02 DIAGNOSIS — M19019 Primary osteoarthritis, unspecified shoulder: Secondary | ICD-10-CM | POA: Diagnosis not present

## 2022-01-02 DIAGNOSIS — G894 Chronic pain syndrome: Secondary | ICD-10-CM | POA: Diagnosis not present

## 2022-01-02 DIAGNOSIS — M47818 Spondylosis without myelopathy or radiculopathy, sacral and sacrococcygeal region: Secondary | ICD-10-CM | POA: Diagnosis not present

## 2022-01-31 DIAGNOSIS — F17210 Nicotine dependence, cigarettes, uncomplicated: Secondary | ICD-10-CM | POA: Diagnosis not present

## 2022-01-31 DIAGNOSIS — M79672 Pain in left foot: Secondary | ICD-10-CM | POA: Diagnosis not present

## 2022-01-31 DIAGNOSIS — M47818 Spondylosis without myelopathy or radiculopathy, sacral and sacrococcygeal region: Secondary | ICD-10-CM | POA: Diagnosis not present

## 2022-01-31 DIAGNOSIS — G8929 Other chronic pain: Secondary | ICD-10-CM | POA: Diagnosis not present

## 2022-01-31 DIAGNOSIS — M24532 Contracture, left wrist: Secondary | ICD-10-CM | POA: Diagnosis not present

## 2022-01-31 DIAGNOSIS — M545 Low back pain, unspecified: Secondary | ICD-10-CM | POA: Diagnosis not present

## 2022-01-31 DIAGNOSIS — G894 Chronic pain syndrome: Secondary | ICD-10-CM | POA: Diagnosis not present

## 2022-01-31 DIAGNOSIS — M79602 Pain in left arm: Secondary | ICD-10-CM | POA: Diagnosis not present

## 2022-01-31 DIAGNOSIS — M19019 Primary osteoarthritis, unspecified shoulder: Secondary | ICD-10-CM | POA: Diagnosis not present

## 2022-03-01 DIAGNOSIS — M19019 Primary osteoarthritis, unspecified shoulder: Secondary | ICD-10-CM | POA: Diagnosis not present

## 2022-03-01 DIAGNOSIS — G8929 Other chronic pain: Secondary | ICD-10-CM | POA: Diagnosis not present

## 2022-03-01 DIAGNOSIS — M79602 Pain in left arm: Secondary | ICD-10-CM | POA: Diagnosis not present

## 2022-03-01 DIAGNOSIS — M79672 Pain in left foot: Secondary | ICD-10-CM | POA: Diagnosis not present

## 2022-03-01 DIAGNOSIS — Z79899 Other long term (current) drug therapy: Secondary | ICD-10-CM | POA: Diagnosis not present

## 2022-03-01 DIAGNOSIS — M24532 Contracture, left wrist: Secondary | ICD-10-CM | POA: Diagnosis not present

## 2022-03-01 DIAGNOSIS — M545 Low back pain, unspecified: Secondary | ICD-10-CM | POA: Diagnosis not present

## 2022-03-01 DIAGNOSIS — M47818 Spondylosis without myelopathy or radiculopathy, sacral and sacrococcygeal region: Secondary | ICD-10-CM | POA: Diagnosis not present

## 2022-03-01 DIAGNOSIS — F17210 Nicotine dependence, cigarettes, uncomplicated: Secondary | ICD-10-CM | POA: Diagnosis not present

## 2022-03-27 DIAGNOSIS — M19019 Primary osteoarthritis, unspecified shoulder: Secondary | ICD-10-CM | POA: Diagnosis not present

## 2022-03-27 DIAGNOSIS — F17210 Nicotine dependence, cigarettes, uncomplicated: Secondary | ICD-10-CM | POA: Diagnosis not present

## 2022-03-27 DIAGNOSIS — M47818 Spondylosis without myelopathy or radiculopathy, sacral and sacrococcygeal region: Secondary | ICD-10-CM | POA: Diagnosis not present

## 2022-03-27 DIAGNOSIS — M24532 Contracture, left wrist: Secondary | ICD-10-CM | POA: Diagnosis not present

## 2022-03-27 DIAGNOSIS — G894 Chronic pain syndrome: Secondary | ICD-10-CM | POA: Diagnosis not present

## 2022-03-27 DIAGNOSIS — M79602 Pain in left arm: Secondary | ICD-10-CM | POA: Diagnosis not present

## 2022-03-27 DIAGNOSIS — M79672 Pain in left foot: Secondary | ICD-10-CM | POA: Diagnosis not present

## 2022-03-27 DIAGNOSIS — M545 Low back pain, unspecified: Secondary | ICD-10-CM | POA: Diagnosis not present

## 2022-03-27 DIAGNOSIS — G8929 Other chronic pain: Secondary | ICD-10-CM | POA: Diagnosis not present

## 2022-05-08 DIAGNOSIS — G894 Chronic pain syndrome: Secondary | ICD-10-CM | POA: Diagnosis not present

## 2022-05-08 DIAGNOSIS — M47818 Spondylosis without myelopathy or radiculopathy, sacral and sacrococcygeal region: Secondary | ICD-10-CM | POA: Diagnosis not present

## 2022-05-08 DIAGNOSIS — M545 Low back pain, unspecified: Secondary | ICD-10-CM | POA: Diagnosis not present

## 2022-05-08 DIAGNOSIS — M79672 Pain in left foot: Secondary | ICD-10-CM | POA: Diagnosis not present

## 2022-05-08 DIAGNOSIS — M19019 Primary osteoarthritis, unspecified shoulder: Secondary | ICD-10-CM | POA: Diagnosis not present

## 2022-05-08 DIAGNOSIS — M79602 Pain in left arm: Secondary | ICD-10-CM | POA: Diagnosis not present

## 2022-05-08 DIAGNOSIS — M24532 Contracture, left wrist: Secondary | ICD-10-CM | POA: Diagnosis not present

## 2022-05-08 DIAGNOSIS — F17210 Nicotine dependence, cigarettes, uncomplicated: Secondary | ICD-10-CM | POA: Diagnosis not present

## 2022-05-08 DIAGNOSIS — G8929 Other chronic pain: Secondary | ICD-10-CM | POA: Diagnosis not present

## 2022-06-06 DIAGNOSIS — Z79899 Other long term (current) drug therapy: Secondary | ICD-10-CM | POA: Diagnosis not present

## 2022-06-06 DIAGNOSIS — G8929 Other chronic pain: Secondary | ICD-10-CM | POA: Diagnosis not present

## 2022-06-06 DIAGNOSIS — M47818 Spondylosis without myelopathy or radiculopathy, sacral and sacrococcygeal region: Secondary | ICD-10-CM | POA: Diagnosis not present

## 2022-06-06 DIAGNOSIS — M24532 Contracture, left wrist: Secondary | ICD-10-CM | POA: Diagnosis not present

## 2022-06-06 DIAGNOSIS — F17210 Nicotine dependence, cigarettes, uncomplicated: Secondary | ICD-10-CM | POA: Diagnosis not present

## 2022-06-06 DIAGNOSIS — M79602 Pain in left arm: Secondary | ICD-10-CM | POA: Diagnosis not present

## 2022-06-06 DIAGNOSIS — M461 Sacroiliitis, not elsewhere classified: Secondary | ICD-10-CM | POA: Diagnosis not present

## 2022-06-06 DIAGNOSIS — M19019 Primary osteoarthritis, unspecified shoulder: Secondary | ICD-10-CM | POA: Diagnosis not present

## 2022-06-06 DIAGNOSIS — G894 Chronic pain syndrome: Secondary | ICD-10-CM | POA: Diagnosis not present

## 2022-06-06 DIAGNOSIS — M79672 Pain in left foot: Secondary | ICD-10-CM | POA: Diagnosis not present

## 2022-06-06 DIAGNOSIS — M545 Low back pain, unspecified: Secondary | ICD-10-CM | POA: Diagnosis not present

## 2022-07-04 DIAGNOSIS — F17210 Nicotine dependence, cigarettes, uncomplicated: Secondary | ICD-10-CM | POA: Diagnosis not present

## 2022-07-04 DIAGNOSIS — M79602 Pain in left arm: Secondary | ICD-10-CM | POA: Diagnosis not present

## 2022-07-04 DIAGNOSIS — M24532 Contracture, left wrist: Secondary | ICD-10-CM | POA: Diagnosis not present

## 2022-07-04 DIAGNOSIS — M79672 Pain in left foot: Secondary | ICD-10-CM | POA: Diagnosis not present

## 2022-07-04 DIAGNOSIS — G8929 Other chronic pain: Secondary | ICD-10-CM | POA: Diagnosis not present

## 2022-07-04 DIAGNOSIS — M47818 Spondylosis without myelopathy or radiculopathy, sacral and sacrococcygeal region: Secondary | ICD-10-CM | POA: Diagnosis not present

## 2022-07-04 DIAGNOSIS — M19019 Primary osteoarthritis, unspecified shoulder: Secondary | ICD-10-CM | POA: Diagnosis not present

## 2022-07-04 DIAGNOSIS — M545 Low back pain, unspecified: Secondary | ICD-10-CM | POA: Diagnosis not present

## 2022-07-04 DIAGNOSIS — Z79899 Other long term (current) drug therapy: Secondary | ICD-10-CM | POA: Diagnosis not present

## 2022-07-04 DIAGNOSIS — G894 Chronic pain syndrome: Secondary | ICD-10-CM | POA: Diagnosis not present

## 2022-07-09 DIAGNOSIS — Z79899 Other long term (current) drug therapy: Secondary | ICD-10-CM | POA: Diagnosis not present

## 2022-08-28 DIAGNOSIS — G8929 Other chronic pain: Secondary | ICD-10-CM | POA: Diagnosis not present

## 2022-08-28 DIAGNOSIS — Z79899 Other long term (current) drug therapy: Secondary | ICD-10-CM | POA: Diagnosis not present

## 2022-08-28 DIAGNOSIS — M47818 Spondylosis without myelopathy or radiculopathy, sacral and sacrococcygeal region: Secondary | ICD-10-CM | POA: Diagnosis not present

## 2022-08-28 DIAGNOSIS — M24532 Contracture, left wrist: Secondary | ICD-10-CM | POA: Diagnosis not present

## 2022-08-28 DIAGNOSIS — G894 Chronic pain syndrome: Secondary | ICD-10-CM | POA: Diagnosis not present

## 2022-08-28 DIAGNOSIS — M79602 Pain in left arm: Secondary | ICD-10-CM | POA: Diagnosis not present

## 2022-08-28 DIAGNOSIS — M19019 Primary osteoarthritis, unspecified shoulder: Secondary | ICD-10-CM | POA: Diagnosis not present

## 2022-08-28 DIAGNOSIS — M79672 Pain in left foot: Secondary | ICD-10-CM | POA: Diagnosis not present

## 2022-08-28 DIAGNOSIS — F17210 Nicotine dependence, cigarettes, uncomplicated: Secondary | ICD-10-CM | POA: Diagnosis not present

## 2022-08-28 DIAGNOSIS — M545 Low back pain, unspecified: Secondary | ICD-10-CM | POA: Diagnosis not present

## 2022-08-30 DIAGNOSIS — Z79899 Other long term (current) drug therapy: Secondary | ICD-10-CM | POA: Diagnosis not present

## 2022-09-26 DIAGNOSIS — M79672 Pain in left foot: Secondary | ICD-10-CM | POA: Diagnosis not present

## 2022-09-26 DIAGNOSIS — G894 Chronic pain syndrome: Secondary | ICD-10-CM | POA: Diagnosis not present

## 2022-09-26 DIAGNOSIS — M47818 Spondylosis without myelopathy or radiculopathy, sacral and sacrococcygeal region: Secondary | ICD-10-CM | POA: Diagnosis not present

## 2022-09-26 DIAGNOSIS — M19019 Primary osteoarthritis, unspecified shoulder: Secondary | ICD-10-CM | POA: Diagnosis not present

## 2022-09-26 DIAGNOSIS — F17210 Nicotine dependence, cigarettes, uncomplicated: Secondary | ICD-10-CM | POA: Diagnosis not present

## 2022-09-26 DIAGNOSIS — M79602 Pain in left arm: Secondary | ICD-10-CM | POA: Diagnosis not present

## 2022-09-26 DIAGNOSIS — M545 Low back pain, unspecified: Secondary | ICD-10-CM | POA: Diagnosis not present

## 2022-09-26 DIAGNOSIS — M24532 Contracture, left wrist: Secondary | ICD-10-CM | POA: Diagnosis not present

## 2022-09-26 DIAGNOSIS — Z79899 Other long term (current) drug therapy: Secondary | ICD-10-CM | POA: Diagnosis not present

## 2022-09-26 DIAGNOSIS — M79671 Pain in right foot: Secondary | ICD-10-CM | POA: Diagnosis not present

## 2022-09-30 DIAGNOSIS — Z79899 Other long term (current) drug therapy: Secondary | ICD-10-CM | POA: Diagnosis not present

## 2022-10-28 DIAGNOSIS — Z79899 Other long term (current) drug therapy: Secondary | ICD-10-CM | POA: Diagnosis not present

## 2022-10-28 DIAGNOSIS — M545 Low back pain, unspecified: Secondary | ICD-10-CM | POA: Diagnosis not present

## 2022-10-28 DIAGNOSIS — M47818 Spondylosis without myelopathy or radiculopathy, sacral and sacrococcygeal region: Secondary | ICD-10-CM | POA: Diagnosis not present

## 2022-10-28 DIAGNOSIS — M79602 Pain in left arm: Secondary | ICD-10-CM | POA: Diagnosis not present

## 2022-10-28 DIAGNOSIS — M24532 Contracture, left wrist: Secondary | ICD-10-CM | POA: Diagnosis not present

## 2022-10-28 DIAGNOSIS — M19019 Primary osteoarthritis, unspecified shoulder: Secondary | ICD-10-CM | POA: Diagnosis not present

## 2022-10-28 DIAGNOSIS — M79672 Pain in left foot: Secondary | ICD-10-CM | POA: Diagnosis not present

## 2022-10-28 DIAGNOSIS — F339 Major depressive disorder, recurrent, unspecified: Secondary | ICD-10-CM | POA: Diagnosis not present

## 2022-10-28 DIAGNOSIS — G894 Chronic pain syndrome: Secondary | ICD-10-CM | POA: Diagnosis not present

## 2022-10-30 DIAGNOSIS — Z79899 Other long term (current) drug therapy: Secondary | ICD-10-CM | POA: Diagnosis not present

## 2022-11-27 DIAGNOSIS — M19019 Primary osteoarthritis, unspecified shoulder: Secondary | ICD-10-CM | POA: Diagnosis not present

## 2022-11-27 DIAGNOSIS — M79602 Pain in left arm: Secondary | ICD-10-CM | POA: Diagnosis not present

## 2022-11-27 DIAGNOSIS — G894 Chronic pain syndrome: Secondary | ICD-10-CM | POA: Diagnosis not present

## 2022-11-27 DIAGNOSIS — M545 Low back pain, unspecified: Secondary | ICD-10-CM | POA: Diagnosis not present

## 2022-11-27 DIAGNOSIS — M47818 Spondylosis without myelopathy or radiculopathy, sacral and sacrococcygeal region: Secondary | ICD-10-CM | POA: Diagnosis not present

## 2022-11-27 DIAGNOSIS — M24532 Contracture, left wrist: Secondary | ICD-10-CM | POA: Diagnosis not present

## 2022-11-27 DIAGNOSIS — Z79899 Other long term (current) drug therapy: Secondary | ICD-10-CM | POA: Diagnosis not present

## 2022-11-27 DIAGNOSIS — F17210 Nicotine dependence, cigarettes, uncomplicated: Secondary | ICD-10-CM | POA: Diagnosis not present

## 2022-11-27 DIAGNOSIS — M79672 Pain in left foot: Secondary | ICD-10-CM | POA: Diagnosis not present

## 2022-11-29 DIAGNOSIS — Z79899 Other long term (current) drug therapy: Secondary | ICD-10-CM | POA: Diagnosis not present

## 2022-12-26 DIAGNOSIS — F339 Major depressive disorder, recurrent, unspecified: Secondary | ICD-10-CM | POA: Diagnosis not present

## 2022-12-26 DIAGNOSIS — M19019 Primary osteoarthritis, unspecified shoulder: Secondary | ICD-10-CM | POA: Diagnosis not present

## 2022-12-26 DIAGNOSIS — M47818 Spondylosis without myelopathy or radiculopathy, sacral and sacrococcygeal region: Secondary | ICD-10-CM | POA: Diagnosis not present

## 2022-12-26 DIAGNOSIS — M24532 Contracture, left wrist: Secondary | ICD-10-CM | POA: Diagnosis not present

## 2022-12-26 DIAGNOSIS — Z79899 Other long term (current) drug therapy: Secondary | ICD-10-CM | POA: Diagnosis not present

## 2022-12-26 DIAGNOSIS — M79672 Pain in left foot: Secondary | ICD-10-CM | POA: Diagnosis not present

## 2022-12-26 DIAGNOSIS — M79602 Pain in left arm: Secondary | ICD-10-CM | POA: Diagnosis not present

## 2022-12-26 DIAGNOSIS — G894 Chronic pain syndrome: Secondary | ICD-10-CM | POA: Diagnosis not present

## 2022-12-26 DIAGNOSIS — M545 Low back pain, unspecified: Secondary | ICD-10-CM | POA: Diagnosis not present

## 2023-02-05 DIAGNOSIS — M545 Low back pain, unspecified: Secondary | ICD-10-CM | POA: Diagnosis not present

## 2023-05-02 DIAGNOSIS — I10 Essential (primary) hypertension: Secondary | ICD-10-CM | POA: Diagnosis not present

## 2023-05-02 DIAGNOSIS — M545 Low back pain, unspecified: Secondary | ICD-10-CM | POA: Diagnosis not present

## 2023-05-02 DIAGNOSIS — Z13220 Encounter for screening for lipoid disorders: Secondary | ICD-10-CM | POA: Diagnosis not present

## 2023-05-02 DIAGNOSIS — Z131 Encounter for screening for diabetes mellitus: Secondary | ICD-10-CM | POA: Diagnosis not present

## 2023-05-02 DIAGNOSIS — Z125 Encounter for screening for malignant neoplasm of prostate: Secondary | ICD-10-CM | POA: Diagnosis not present
